# Patient Record
Sex: Male | Born: 1965 | Race: White | Hispanic: No | Marital: Married | Smoking: Never smoker
Health system: Southern US, Community
[De-identification: ages and names within clinical notes are randomized; demographics above are authoritative.]

## PROBLEM LIST (undated history)

## (undated) DIAGNOSIS — R7989 Other specified abnormal findings of blood chemistry: Secondary | ICD-10-CM

## (undated) DIAGNOSIS — J90 Pleural effusion, not elsewhere classified: Secondary | ICD-10-CM

## (undated) DIAGNOSIS — G4730 Sleep apnea, unspecified: Secondary | ICD-10-CM

## (undated) DIAGNOSIS — E669 Obesity, unspecified: Secondary | ICD-10-CM

## (undated) DIAGNOSIS — M1990 Unspecified osteoarthritis, unspecified site: Secondary | ICD-10-CM

## (undated) DIAGNOSIS — I4891 Unspecified atrial fibrillation: Secondary | ICD-10-CM

## (undated) HISTORY — PX: KNEE CARTILAGE SURGERY: SHX688

## (undated) HISTORY — DX: Pleural effusion, not elsewhere classified: J90

## (undated) HISTORY — PX: CHOLECYSTECTOMY: SHX55

## (undated) HISTORY — PX: BUNIONECTOMY: SHX129

---

## 2012-08-04 DIAGNOSIS — Z5329 Procedure and treatment not carried out because of patient's decision for other reasons: Secondary | ICD-10-CM | POA: Insufficient documentation

## 2012-08-04 HISTORY — DX: Procedure and treatment not carried out because of patient's decision for other reasons: Z53.29

## 2013-12-05 DIAGNOSIS — H4911 Fourth [trochlear] nerve palsy, right eye: Secondary | ICD-10-CM

## 2013-12-05 DIAGNOSIS — H5010 Unspecified exotropia: Secondary | ICD-10-CM

## 2013-12-05 DIAGNOSIS — IMO0002 Reserved for concepts with insufficient information to code with codable children: Secondary | ICD-10-CM | POA: Insufficient documentation

## 2013-12-05 HISTORY — DX: Unspecified exotropia: H50.10

## 2013-12-05 HISTORY — DX: Fourth (trochlear) nerve palsy, right eye: H49.11

## 2015-05-19 ENCOUNTER — Observation Stay (HOSPITAL_COMMUNITY)
Admission: EM | Admit: 2015-05-19 | Discharge: 2015-05-20 | Disposition: A | Discharge status: Home / Self Care | Payer: BLUE CROSS/BLUE SHIELD | Attending: Cardiology | Admitting: Cardiology

## 2015-05-19 ENCOUNTER — Emergency Department (HOSPITAL_COMMUNITY): Payer: BLUE CROSS/BLUE SHIELD

## 2015-05-19 ENCOUNTER — Encounter (HOSPITAL_COMMUNITY): Payer: Self-pay | Admitting: *Deleted

## 2015-05-19 DIAGNOSIS — G4730 Sleep apnea, unspecified: Secondary | ICD-10-CM | POA: Diagnosis not present

## 2015-05-19 DIAGNOSIS — Z791 Long term (current) use of non-steroidal anti-inflammatories (NSAID): Secondary | ICD-10-CM | POA: Insufficient documentation

## 2015-05-19 DIAGNOSIS — I4891 Unspecified atrial fibrillation: Secondary | ICD-10-CM

## 2015-05-19 DIAGNOSIS — E669 Obesity, unspecified: Secondary | ICD-10-CM | POA: Diagnosis not present

## 2015-05-19 DIAGNOSIS — I480 Paroxysmal atrial fibrillation: Secondary | ICD-10-CM | POA: Diagnosis not present

## 2015-05-19 HISTORY — DX: Other specified abnormal findings of blood chemistry: R79.89

## 2015-05-19 HISTORY — DX: Sleep apnea, unspecified: G47.30

## 2015-05-19 HISTORY — DX: Unspecified atrial fibrillation: I48.91

## 2015-05-19 HISTORY — DX: Obesity, unspecified: E66.9

## 2015-05-19 LAB — CBC
HCT: 46.2 % (ref 39.0–52.0)
HEMOGLOBIN: 16.6 g/dL (ref 13.0–17.0)
MCH: 32.1 pg (ref 26.0–34.0)
MCHC: 35.9 g/dL (ref 30.0–36.0)
MCV: 89.4 fL (ref 78.0–100.0)
PLATELETS: 139 10*3/uL — AB (ref 150–400)
RBC: 5.17 MIL/uL (ref 4.22–5.81)
RDW: 12.1 % (ref 11.5–15.5)
WBC: 7.6 10*3/uL (ref 4.0–10.5)

## 2015-05-19 LAB — HEPATIC FUNCTION PANEL
ALT: 20 U/L (ref 17–63)
AST: 23 U/L (ref 15–41)
Albumin: 3.7 g/dL (ref 3.5–5.0)
Alkaline Phosphatase: 66 U/L (ref 38–126)
BILIRUBIN INDIRECT: 0.4 mg/dL (ref 0.3–0.9)
Bilirubin, Direct: 0.2 mg/dL (ref 0.1–0.5)
Total Bilirubin: 0.6 mg/dL (ref 0.3–1.2)
Total Protein: 6.4 g/dL — ABNORMAL LOW (ref 6.5–8.1)

## 2015-05-19 LAB — BASIC METABOLIC PANEL
Anion gap: 9 (ref 5–15)
BUN: 13 mg/dL (ref 6–20)
CALCIUM: 9.3 mg/dL (ref 8.9–10.3)
CO2: 25 mmol/L (ref 22–32)
CREATININE: 1.04 mg/dL (ref 0.61–1.24)
Chloride: 107 mmol/L (ref 101–111)
GFR calc Af Amer: >60 mL/min (ref >60)
GFR calc non Af Amer: >60 mL/min (ref >60)
Glucose, Bld: 110 mg/dL — ABNORMAL HIGH (ref 65–99)
Potassium: 3.8 mmol/L (ref 3.5–5.1)
SODIUM: 141 mmol/L (ref 135–145)

## 2015-05-19 LAB — T4, FREE: Free T4: 0.88 ng/dL (ref 0.61–1.12)

## 2015-05-19 LAB — MAGNESIUM: Magnesium: 1.9 mg/dL (ref 1.7–2.4)

## 2015-05-19 LAB — PROTIME-INR
INR: 1.07 (ref 0.00–1.49)
Prothrombin Time: 14.1 seconds (ref 11.6–15.2)

## 2015-05-19 LAB — I-STAT TROPONIN, ED: Troponin i, poc: 0 ng/mL (ref 0.00–0.08)

## 2015-05-19 LAB — TSH: TSH: 1.493 u[IU]/mL (ref 0.350–4.500)

## 2015-05-19 MED ORDER — ACETAMINOPHEN 325 MG PO TABS: 650.0000 mg | ORAL_TABLET | ORAL | Status: DC | PRN

## 2015-05-19 MED ORDER — SODIUM CHLORIDE 0.9 % IV SOLN
INTRAVENOUS | Status: DC
  Administered 2015-05-19: 18:00:00 via INTRAVENOUS

## 2015-05-19 MED ORDER — ONDANSETRON HCL 4 MG/2ML IJ SOLN: 4.0000 mg | Freq: Four times a day (QID) | INTRAMUSCULAR | Status: DC | PRN

## 2015-05-19 MED ORDER — HEPARIN BOLUS VIA INFUSION
5000.0000 [IU] | Freq: Once | INTRAVENOUS | Status: AC
  Administered 2015-05-19: 5000 [IU] via INTRAVENOUS
  Filled 2015-05-19: qty 5000

## 2015-05-19 MED ORDER — DILTIAZEM HCL 100 MG IV SOLR
5.0000 mg/h | INTRAVENOUS | Status: DC
  Administered 2015-05-19: 5 mg/h via INTRAVENOUS
  Filled 2015-05-19 (×2): qty 100

## 2015-05-19 MED ORDER — ZOLPIDEM TARTRATE 5 MG PO TABS
5.0000 mg | ORAL_TABLET | Freq: Every evening | ORAL | Status: DC | PRN
Start: 2015-05-19 — End: 2015-05-20

## 2015-05-19 MED ORDER — CLOMIPHENE CITRATE 50 MG PO TABS
25.0000 mg | ORAL_TABLET | Freq: Every day | ORAL | Status: DC
  Filled 2015-05-19 (×2): qty 1

## 2015-05-19 MED ORDER — DILTIAZEM HCL 100 MG IV SOLR
5.0000 mg/h | INTRAVENOUS | Status: DC
  Administered 2015-05-19: 5 mg/h via INTRAVENOUS

## 2015-05-19 MED ORDER — DILTIAZEM LOAD VIA INFUSION
20.0000 mg | Freq: Once | INTRAVENOUS | Status: AC
  Administered 2015-05-19: 20 mg via INTRAVENOUS
  Filled 2015-05-19: qty 20

## 2015-05-19 MED ORDER — FLECAINIDE ACETATE 50 MG PO TABS
150.0000 mg | ORAL_TABLET | Freq: Two times a day (BID) | ORAL | Status: DC
  Administered 2015-05-19: 150 mg via ORAL
  Filled 2015-05-19 (×4): qty 1

## 2015-05-19 MED ORDER — HEPARIN (PORCINE) IN NACL 100-0.45 UNIT/ML-% IJ SOLN
1600.0000 [IU]/h | INTRAMUSCULAR | Status: DC
  Administered 2015-05-19: 1400 [IU]/h via INTRAVENOUS
  Filled 2015-05-19 (×2): qty 250

## 2015-05-19 MED ORDER — SODIUM CHLORIDE 0.9 % IV BOLUS (SEPSIS)
1000.0000 mL | Freq: Once | INTRAVENOUS | Status: AC
  Administered 2015-05-19: 1000 mL via INTRAVENOUS

## 2015-05-19 NOTE — H&P (Signed)
History and Physical   Admit date: 05/19/2015 Name:  Gabriel Carroll Medical record number: [id number] DOB/Age:  December 05, 1965  49 y.o. male  Referring Physician:   [facility]  Chief complaint/reason for admission: Atrial fibrillation   HPI:  This 49 year old male is previously been in good health.  After a prolonged bicycle ride in [place] around 5 years ago he had atrial fibrillation and was admitted to the hospital overnight.  He describes having had an echo and stress testing and converted spontaneously.  Since that time he has had episodic atrial fibrillation usually precipitated by running or exercise.  He usually has not lasted that long and typically would go away.  He had a prolonged episode that started yesterday after he had worked outside of mode the grass and while getting ready to put some things in the car last evening around 5 PM that the onset of palpitations and this has persisted until today at which point he came to the emergency room.  He was found to be in rapid atrial fibrillation and was treated with intravenous diltiazem.  It is right at the 24-hour time [place] for onset of action.  He describes sleep apnea testing in the past and has gained some weight recently.  There is no excess alcohol use and no hypertension or other risk factors.  No family history of atrial fibrillation.    Past Medical History  Diagnosis Date  . Atrial fibrillation   . Low testosterone   . Obesity (BMI 30-39.9)   . Sleep apnea      Past Surgical History  Procedure Laterality Date  . Liver doner    . Lung surgey    . Cholecystectomy    . Bunionectomy    . Shoulder surgery rt    . Knee cartilage surgery      lt   Allergies: has No Known Allergies.   Medications: Prior to Admission medications   Medication Sig Start Date End Date Taking? Authorizing Provider  clomiPHENE (CLOMID) 50 MG tablet Take 25 mg by mouth daily.   Yes Historical Provider, MD  ibuprofen (ADVIL,MOTRIN) 200 MG  tablet Take 400 mg by mouth daily as needed for headache (pain).   Yes Historical Provider, MD   Family History:  Family Status  Relation Status Death Age  . Mother Deceased 34  . Father Deceased 64    Cancer  . Paternal Aunt Deceased   . Paternal Uncle Deceased    Social History:   reports that he has never smoked. He has never used smokeless tobacco. He reports that he drinks about 3.0 oz of alcohol per week. He reports that he does not use illicit drugs.   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  Has 2 children.   Review of Systems: Wife reports that he snores and has periods of apnea at night. Other than as noted above, the remainder of the review of systems is normal  Physical Exam: BP 90/63 mmHg  Pulse 74  Temp(Src) 98.1 F (36.7 C) (Oral)  Resp 19  SpO2 96% General appearance: Pleasant mildly obese male in no acute distress Head: Normocephalic, without obvious abnormality, atraumatic Eyes: conjunctivae/corneas clear. PERRL, EOM's intact. Fundi not examined  Neck: no adenopathy, no carotid bruit, no JVD and supple, symmetrical, trachea midline Lungs: clear to auscultation bilaterally Heart:irregular rate and rhythm, S1, S2 normal, no murmur, click, rub or gallop Abdomen: soft, non-tender; bowel sounds normal; no masses,  no  organomegaly Rectal: deferred Extremities: extremities normal, atraumatic, no cyanosis or edema Pulses: 2+ and symmetric Skin: Skin color, texture, turgor normal. No rashes or lesions Neurologic: Grossly normal  Labs: CBC  Recent Labs  05/19/15 1450  WBC 7.6  RBC 5.17  HGB 16.6  HCT 46.2  PLT 139*  MCV 89.4  MCH 32.1  MCHC 35.9  RDW 12.1   CMP   Recent Labs  05/19/15 1450  NA 141  K 3.8  CL 107  CO2 25  GLUCOSE 110*  BUN 13  CREATININE 1.04  CALCIUM 9.3  GFRNONAA >60  GFRAA >60    EKG: Atrial fibrillation with rapid response  Radiology: No acute disease   IMPRESSIONS: 1.  Paroxysmal atrial  fibrillation with rapid ventricular response CHA2DS2VASC score 0 2.  Obesity 3.  Sleep apnea  PLAN: Duration of onset is around 24 hours ago.  We'll place on heparin and check echo in the morning.  Check thyroid function tests.  Trial of flecainide to see if he will be able to be converted to sinus rhythm.  May need reevaluation of sleep apnea as may be cost effective in atrial fibrillation.  May be candidate for pill in the pocket.  Signed: Kerry Hough MD [facility]  05/19/2015, 6:07 PM

## 2015-05-19 NOTE — ED Notes (Signed)
Pt reports hx of afib. Having episodes of feeling like HR is irregular, this episode has been constant since last night, now has mild chest discomfort. Denies SOB.

## 2015-05-19 NOTE — ED Provider Notes (Signed)
CSN: [id number]     Arrival date & time 05/19/15  1349 History   First MD Initiated Contact with Patient 05/19/15 1417     Chief Complaint  Patient presents with  . Irregular Heart Beat  . Chest Pain     (Consider location/radiation/quality/duration/timing/severity/associated sxs/prior Treatment) Patient is a 49 y.o. male presenting with chest pain. The history is provided by the patient.  Chest Pain Pain location:  Substernal area Pain quality: pressure   Pain radiates to:  Does not radiate Pain radiates to the back: no   Pain severity:  Mild Onset quality:  Gradual Timing:  Intermittent Progression:  Resolved Chronicity:  New Context comment:  New onset a-fib Relieved by:  Nothing Worsened by:  Nothing tried Ineffective treatments:  None tried Associated symptoms: no fever, no nausea, no shortness of breath and no weakness   Risk factors comment:  Prior episodes of a fib   Past Medical History  Diagnosis Date  . Atrial fibrillation   . Low testosterone    History reviewed. No pertinent past surgical history. History reviewed. No pertinent family history. History  Substance Use Topics  . Smoking status: Not on file  . Smokeless tobacco: Not on file  . Alcohol Use: Yes     Comment: occ    Review of Systems  Constitutional: Negative for fever.  Respiratory: Negative for shortness of breath.   Cardiovascular: Positive for chest pain.  Gastrointestinal: Negative for nausea.  Neurological: Negative for weakness.  All other systems reviewed and are negative.     Allergies  Review of patient's allergies indicates no known allergies.  Home Medications   Prior to Admission medications   Not on File   BP 92/67 mmHg  Pulse 55  Temp(Src) 98.1 F (36.7 C) (Oral)  Resp 19  SpO2 97% Physical Exam  Constitutional: He is oriented to person, place, and time. He appears well-developed and well-nourished. No distress.  HENT:  Head: Normocephalic and atraumatic.   Eyes: Conjunctivae are normal.  Neck: Neck supple. No tracheal deviation present.  Cardiovascular: Normal heart sounds.  An irregularly irregular rhythm present. Tachycardia present.   Pulmonary/Chest: Effort normal and breath sounds normal. No respiratory distress.  Abdominal: Soft. He exhibits no distension.  Neurological: He is alert and oriented to person, place, and time.  Skin: Skin is warm and dry.  Psychiatric: He has a normal mood and affect.    ED Course  Procedures (including critical care time) CRITICAL CARE Performed by: Leo Grosser Total critical care time: 30 Critical care time was exclusive of separately billable procedures and treating other patients. Critical care was necessary to treat or prevent imminent or life-threatening deterioration. Critical care was time spent personally by me on the following activities: development of treatment plan with patient and/or surrogate as well as nursing, discussions with consultants, evaluation of patient's response to treatment, examination of patient, obtaining history from patient or surrogate, ordering and performing treatments and interventions, ordering and review of laboratory studies, ordering and review of radiographic studies, pulse oximetry and re-evaluation of patient's condition.   Labs Review Labs Reviewed  CBC - Abnormal; Notable for the following:    Platelets 139 (*)    All other components within normal limits  PROTIME-INR  BASIC METABOLIC PANEL  Randolm Idol, ED    Imaging Review Dg Chest Port 1 View  05/19/2015   CLINICAL DATA:  Irregular heart beat.  EXAM: PORTABLE CHEST - 1 VIEW  COMPARISON:  None.  FINDINGS: The heart  size and mediastinal contours are within normal limits. Both lungs are clear. The visualized skeletal structures are unremarkable.  IMPRESSION: No active disease.   Electronically Signed   By: Lovey Newcomer M.D.   On: 05/19/2015 14:48   I independently viewed and interpreted the above  radiology studies and agree with radiologist report.    EKG Interpretation   Date/Time:  Sunday May 19 2015 14:06:03 EDT Ventricular Rate:  133 PR Interval:    QRS Duration: 86 QT Interval:  288 QTC Calculation: 428 R Axis:   81 Text Interpretation:  Atrial fibrillation with rapid ventricular response  Abnormal ECG Confirmed by Taira Knabe MD, Quillian Quince ([id number]) on 05/19/2015 2:15:40 PM      MDM   Final diagnoses:  Atrial fibrillation with RVR    49 year old male presents in atrial fibrillation with rapid ventricular rate. Patient started having A. fib last night and this episode has persisted longer than typical for him. He has been keeping track of his heart rate and it has been from anywhere between 120-170 at home. He has had some ongoing chest pain symptoms that are currently resolved at rest. Troponin is negative, patient is well rate controlled with diltiazem bolus and infusion, not currently anticoagulated and A. fib has been ongoing for some full day so not a good candidate for cardioversion currently.  Cardiology was consulted for admission and establishing care for this patient who is new to the area and previously was seen in [place].     Leo Grosser, MD 05/19/15 [id number]

## 2015-05-19 NOTE — H&P (Signed)
Gabriel Carroll is an 49 y.o. male.    Primary Cardiologist:NEW  No PCP Per Patient  Chief Complaint: atrial fib  HPI: 49 year old male with hx of a fib 5 years or so ago in [place].  Spent the night and converted to SR.  Since that time he has some PAF, brief episodes.  No hx of CAD, stroke, or HTN, diabetes.  Was a liver doner several years ago.    Pt went into a fib 24 hours ago and most of the time it is rapid.  On sofa it will slow down.  He has some chest tightness and anxiety feeling he has with a fib.  Mild SOB.  When he remained in a fib he came to ER.  Now on dilt drip and HR in 70-85.   CHA2DS2VASc score 0.  EKG a fib with RVR.  Troponin 0.00.  Past Medical History  Diagnosis Date  . Atrial fibrillation   . Low testosterone     Past Surgical History  Procedure Laterality Date  . Liver doner    . Lung surgey    . Cholecystectomy    . Bunionectomy    . Shoulder surgery rt    . Knee cartilage surgery      lt    Family History  Problem Relation Age of Onset  . Hypertension Mother   . Hypertension Father   . Cancer Father   . Diabetes Paternal Aunt   . Diabetes Paternal Uncle    Social History:  reports that he has never smoked. He has never used smokeless tobacco. He reports that he drinks about 3.0 oz of alcohol per week. He reports that he does not use illicit drugs.  Allergies: No Known Allergies  OUTPATIENT MEDICATIONS: Medication for low testosterone   Results for orders placed or performed during the hospital encounter of 05/19/15 (from the past 48 hour(s))  Basic metabolic panel     Status: Abnormal   Collection Time: 05/19/15  2:50 PM  Result Value Ref Range   Sodium 141 135 - 145 mmol/L   Potassium 3.8 3.5 - 5.1 mmol/L   Chloride 107 101 - 111 mmol/L   CO2 25 22 - 32 mmol/L   Glucose, Bld 110 (H) 65 - 99 mg/dL   BUN 13 6 - 20 mg/dL   Creatinine, Ser 1.04 0.61 - 1.24 mg/dL   Calcium 9.3 8.9 - 10.3 mg/dL   GFR calc non Af Amer >60  >60 mL/min   GFR calc Af Amer >60 >60 mL/min    Comment: (NOTE) The eGFR has been calculated using the CKD EPI equation. This calculation has not been validated in all clinical situations. eGFR's persistently <60 mL/min signify possible Chronic Kidney Disease.    Anion gap 9 5 - 15  CBC     Status: Abnormal   Collection Time: 05/19/15  2:50 PM  Result Value Ref Range   WBC 7.6 4.0 - 10.5 K/uL   RBC 5.17 4.22 - 5.81 MIL/uL   Hemoglobin 16.6 13.0 - 17.0 g/dL   HCT 46.2 39.0 - 52.0 %   MCV 89.4 78.0 - 100.0 fL   MCH 32.1 26.0 - 34.0 pg   MCHC 35.9 30.0 - 36.0 g/dL   RDW 12.1 11.5 - 15.5 %   Platelets 139 (L) 150 - 400 K/uL  Protime-INR     Status: None   Collection Time: 05/19/15  2:50 PM  Result Value Ref  Range   Prothrombin Time 14.1 11.6 - 15.2 seconds   INR 1.07 0.00 - 1.49  I-stat troponin, ED     Status: None   Collection Time: 05/19/15  3:03 PM  Result Value Ref Range   Troponin i, poc 0.00 0.00 - 0.08 ng/mL   Comment 3            Comment: Due to the release kinetics of cTnI, a negative result within the first hours of the onset of symptoms does not rule out myocardial infarction with certainty. If myocardial infarction is still suspected, repeat the test at appropriate intervals.    Dg Chest Port 1 View  05/19/2015   CLINICAL DATA:  Irregular heart beat.  EXAM: PORTABLE CHEST - 1 VIEW  COMPARISON:  None.  FINDINGS: The heart size and mediastinal contours are within normal limits. Both lungs are clear. The visualized skeletal structures are unremarkable.  IMPRESSION: No active disease.   Electronically Signed   By: Lovey Newcomer M.D.   On: 05/19/2015 14:48    ROS: General:no colds or fevers, no weight changes Skin:no rashes or ulcers HEENT:no blurred vision, no congestion CV:see HPI PUL:see HPI GI:no diarrhea constipation or melena, no indigestion GU:no hematuria, no dysuria MS:no joint pain, no claudication Neuro:no syncope, no lightheadedness Endo:no diabetes,  no thyroid disease  Blood pressure 90/63, pulse 74, temperature 98.1 F (36.7 C), temperature source Oral, resp. rate 19, SpO2 96 %. PE: General:Pleasant affect, NAD Skin:Warm and dry, brisk capillary refill HEENT:normocephalic, sclera clear, mucus membranes moist Neck:supple, no JVD, no bruits  Heart:irreg irreg without murmur, gallup, rub or click Lungs:clear without rales, rhonchi, or wheezes IHK:VQQV, non tender, + BS, do not palpate liver spleen or masses Ext:no lower ext edema, 2+ pedal pulses, 2+ radial pulses Neuro:alert and oriented X 3, MAE, follows commands, + facial symmetry    Assessment/Plan Active Problems:   Atrial fibrillation with RVR for greater than 24 hours.   Will add IV heparin and Dr. Wynonia Lawman to decide medication adjustments.    [place] Nurse Practitioner Certified [place] Pager [phone] or after 5pm or weekends call [phone] 05/19/2015, 5:29 PM

## 2015-05-19 NOTE — ED Notes (Signed)
DR. Wynonia Lawman WANTED PT TO RECEIVE FLECAINIDE NOW, MESSAGE HAS BEEN SENT TO PHARMACY TO SEND TO 2W ASAP.

## 2015-05-19 NOTE — Progress Notes (Signed)
ANTICOAGULATION CONSULT NOTE - Initial Consult  Pharmacy Consult for heparin Indication: atrial fibrillation  No Known Allergies  Patient Measurements: Height: 5\' 10"  (177.8 cm) Weight: 225 lb (102.059 kg) IBW/kg (Calculated) : 73 Heparin Dosing Weight: 94.5 kg  Vital Signs: Temp: 98.1 F (36.7 C) (08/07 1415) Temp Source: Oral (08/07 1415) BP: 111/61 mmHg (08/07 1800) Pulse Rate: 86 (08/07 1800)  Labs:  Recent Labs  05/19/15 1450  HGB 16.6  HCT 46.2  PLT 139*  LABPROT 14.1  INR 1.07  CREATININE 1.04    Estimated Creatinine Clearance: 101.7 mL/min (by C-G formula based on Cr of 1.04).   Medical History: Past Medical History  Diagnosis Date  . Atrial fibrillation   . Low testosterone   . Obesity (BMI 30-39.9)   . Sleep apnea     Assessment: 49 YOM with afib to start IV heparin. He is not on anticoagulation PTA, BL INR 1.07. hgb wnl, plt 139 K.  Goal of Therapy:  Heparin level 0.3-0.7 units/ml Monitor platelets by anticoagulation protocol: Yes   Plan:  - Heparin 5000 units x 1 - Heparin infusion 1400 units/hr  - 6 hr heparin level at 0100 - Daily heparin level and CBC  Maryanna Shape, PharmD, BCPS  Clinical Pharmacist  Pager: [phone]   05/19/2015,6:28 PM

## 2015-05-20 ENCOUNTER — Observation Stay (HOSPITAL_COMMUNITY): Payer: BLUE CROSS/BLUE SHIELD

## 2015-05-20 DIAGNOSIS — G4730 Sleep apnea, unspecified: Secondary | ICD-10-CM | POA: Insufficient documentation

## 2015-05-20 DIAGNOSIS — R7989 Other specified abnormal findings of blood chemistry: Secondary | ICD-10-CM | POA: Insufficient documentation

## 2015-05-20 DIAGNOSIS — E669 Obesity, unspecified: Secondary | ICD-10-CM | POA: Diagnosis not present

## 2015-05-20 DIAGNOSIS — I480 Paroxysmal atrial fibrillation: Secondary | ICD-10-CM | POA: Diagnosis not present

## 2015-05-20 DIAGNOSIS — Z791 Long term (current) use of non-steroidal anti-inflammatories (NSAID): Secondary | ICD-10-CM | POA: Diagnosis not present

## 2015-05-20 DIAGNOSIS — R0681 Apnea, not elsewhere classified: Secondary | ICD-10-CM | POA: Insufficient documentation

## 2015-05-20 LAB — BASIC METABOLIC PANEL
Anion gap: 9 (ref 5–15)
BUN: 12 mg/dL (ref 6–20)
CALCIUM: 9 mg/dL (ref 8.9–10.3)
CHLORIDE: 105 mmol/L (ref 101–111)
CO2: 25 mmol/L (ref 22–32)
Creatinine, Ser: 0.95 mg/dL (ref 0.61–1.24)
GFR calc Af Amer: >60 mL/min (ref >60)
GFR calc non Af Amer: >60 mL/min (ref >60)
Glucose, Bld: 106 mg/dL — ABNORMAL HIGH (ref 65–99)
Potassium: 3.8 mmol/L (ref 3.5–5.1)
Sodium: 139 mmol/L (ref 135–145)

## 2015-05-20 LAB — LIPID PANEL
CHOLESTEROL: 144 mg/dL (ref 0–200)
HDL: 30 mg/dL — ABNORMAL LOW (ref >40)
LDL CALC: 87 mg/dL (ref 0–99)
Total CHOL/HDL Ratio: 4.8 RATIO
Triglycerides: 134 mg/dL (ref <150)
VLDL: 27 mg/dL (ref 0–40)

## 2015-05-20 LAB — HEMOGLOBIN A1C
Hgb A1c MFr Bld: 5 % (ref 4.8–5.6)
Mean Plasma Glucose: 97 mg/dL

## 2015-05-20 LAB — CBC
HEMATOCRIT: 46.4 % (ref 39.0–52.0)
Hemoglobin: 16.1 g/dL (ref 13.0–17.0)
MCH: 31.4 pg (ref 26.0–34.0)
MCHC: 34.7 g/dL (ref 30.0–36.0)
MCV: 90.6 fL (ref 78.0–100.0)
PLATELETS: 130 10*3/uL — AB (ref 150–400)
RBC: 5.12 MIL/uL (ref 4.22–5.81)
RDW: 12.3 % (ref 11.5–15.5)
WBC: 8.2 10*3/uL (ref 4.0–10.5)

## 2015-05-20 LAB — HEPARIN LEVEL (UNFRACTIONATED): Heparin Unfractionated: 0.21 IU/mL — ABNORMAL LOW (ref 0.30–0.70)

## 2015-05-20 MED ORDER — METOPROLOL SUCCINATE ER 25 MG PO TB24
25.0000 mg | ORAL_TABLET | Freq: Every day | ORAL | Status: DC
  Administered 2015-05-20: 25 mg via ORAL
  Filled 2015-05-20: qty 1

## 2015-05-20 MED ORDER — HEPARIN BOLUS VIA INFUSION
2500.0000 [IU] | Freq: Once | INTRAVENOUS | Status: AC
  Administered 2015-05-20: 2500 [IU] via INTRAVENOUS
  Filled 2015-05-20: qty 2500

## 2015-05-20 MED ORDER — METOPROLOL SUCCINATE ER 25 MG PO TB24: 25.0000 mg | ORAL_TABLET | Freq: Every day | ORAL | Status: DC

## 2015-05-20 MED ORDER — FLECAINIDE ACETATE 150 MG PO TABS: 150.0000 mg | ORAL_TABLET | ORAL | Status: DC | PRN

## 2015-05-20 NOTE — Progress Notes (Signed)
ANTICOAGULATION CONSULT NOTE - Follow Up Consult  Pharmacy Consult for Heparin  Indication: atrial fibrillation  No Known Allergies  Patient Measurements: Height: 5\' 10"  (177.8 cm) Weight: 225 lb (102.059 kg) IBW/kg (Calculated) : 73  Vital Signs: Temp: 98.2 F (36.8 C) (08/07 1955) Temp Source: Oral (08/07 1955) BP: 101/59 mmHg (08/07 2001) Pulse Rate: 83 (08/07 1955)  Labs:  Recent Labs  05/19/15 1450 05/20/15 0035  HGB 16.6 16.1  HCT 46.2 46.4  PLT 139* 130*  LABPROT 14.1  --   INR 1.07  --   HEPARINUNFRC  --  0.21*  CREATININE 1.04 0.95    Estimated Creatinine Clearance: 111.3 mL/min (by C-G formula based on Cr of 0.95).  Assessment: 49 y/o M on heparin for afib, first HL is sub-therapeutic at 0.21, other labs reviewed. No issues per RN.   Goal of Therapy:  Heparin level 0.3-0.7 units/ml Monitor platelets by anticoagulation protocol: Yes   Plan:  -Heparin 2500 units BOLUS -Increase heparin drip to 1600 units/hr -0900 HL -Daily CBC/HL -Monitor for bleeding  Narda Bonds 05/20/2015,1:52 AM

## 2015-05-20 NOTE — Progress Notes (Signed)
  Echocardiogram 2D Echocardiogram has been performed.  Gabriel Carroll 05/20/2015, 12:21 PM

## 2015-05-20 NOTE — Progress Notes (Signed)
PT HEART RHYTHM CONVERTED TO SB RATE OF 52.EKG CONFIRMED BY EKG. STOPPED CARDIZEM GTT PER PROTOCOL. CALLED DR. AND MADE HIM AWARE. ORDERS TO DISCONTINUE CARDIZEMGT. MONITORING WILL CONTINUE.

## 2015-05-20 NOTE — Discharge Summary (Signed)
Physician Discharge Summary  Patient ID: Gabriel Carroll MRN: [id number] DOB/AGE: 50/24/1967 50 y.o.  Admit date: 05/19/2015 Discharge date: 05/20/2015  Primary Discharge Diagnosis:  1. Paroxysmal atrial fibrillation  Secondary Discharge Diagnosis: 2. Obesity 3. No testosterone treated with Clomid 4. Sleep apnea   Hospital Course: This 49 year old male has previously been in good health. After a prolonged bicycle ride in [place] around 5 years ago he had atrial fibrillation and was admitted to the hospital overnight. He describes having had an echo and stress testing and converted spontaneously. Since that time he has had episodic atrial fibrillation usually precipitated by running or exercise. He usually has not lasted that long and typically would go away. He had a prolonged episode that started yesterday after he had worked outside cutting the grass and while getting ready to put some things in the car last evening around 5 PM that the onset of palpitations and this has persisted until today at which point he came to the emergency room. He was found to be in rapid atrial fibrillation and was treated with intravenous diltiazem.he presented right at the24-hour time mark for onset of action. He describes sleep apnea testing in the past and has gained some weight recently. There is no excess alcohol use and no hypertension or other risk factors. No family history of atrial fibrillation.  His CHA2DS2VASC score Was calculated to be 0. Because it was right at 24 hours in [place] that of atrial fibrillation he was heparinized and given a dose of flat carotid 150 mg. He converted to sinus rhythm early the next day and remained in sinus rhythm. He was started on intravenous diltiazem when he came into the emergency room with slowing of his heart rate. He will have an echocardiogram that will be interpreted later and will be discharged home on metoprolol 25 mg daily. No anticoagulation necessary. He is  given a prescription of flecainide 150 mg to use as needed. Follow up in the office in one week.  Discharge Exam: Blood pressure 108/63, pulse 80, temperature 98 F (36.7 C), temperature source Oral, resp. rate 18, height 5\' 10"  (1.778 m), weight 101.9 kg (224 lb 10.4 oz), SpO2 99 %. Weight: 101.9 kg (224 lb 10.4 oz) Lungs clear, no S3  Labs: CBC:   Lab Results  Component Value Date   WBC 8.2 05/20/2015   HGB 16.1 05/20/2015   HCT 46.4 05/20/2015   MCV 90.6 05/20/2015   PLT 130* 05/20/2015    CMP:  Recent Labs Lab 05/19/15 1822 05/20/15 0035  NA  --  139  K  --  3.8  CL  --  105  CO2  --  25  BUN  --  12  CREATININE  --  0.95  CALCIUM  --  9.0  PROT 6.4*  --   BILITOT 0.6  --   ALKPHOS 66  --   ALT 20  --   AST 23  --   GLUCOSE  --  106*    Lipid Panel     Component Value Date/Time   CHOL 144 05/20/2015 0035   TRIG 134 05/20/2015 0035   HDL 30* 05/20/2015 0035   CHOLHDL 4.8 05/20/2015 0035   VLDL 27 05/20/2015 0035   LDLCALC 87 05/20/2015 0035    Cardiac Enzymes: Troponin (Point of Care Test)  Recent Labs  05/19/15 1503  TROPIPOC 0.00   Thyroid: Lab Results  Component Value Date   TSH 1.493 05/19/2015    Radiology: No active disease  EKG: Atrial fibrillation and this lay with rapid response on initial EKG. Subsequent EKG after conversion to sinus rhythm is normal with normal sinus rhythm.  Discharge Medications:   Medication List    TAKE these medications        clomiPHENE 50 MG tablet  Commonly known as:  CLOMID  Take 25 mg by mouth daily.     flecainide 150 MG tablet  Commonly known as:  TAMBOCOR  Take 1 tablet (150 mg total) by mouth as needed.     ibuprofen 200 MG tablet  Commonly known as:  ADVIL,MOTRIN  Take 400 mg by mouth daily as needed for headache (pain).     metoprolol succinate 25 MG 24 hr tablet  Commonly known as:  TOPROL-XL  Take 1 tablet (25 mg total) by mouth daily.       Followup plans and  appointments:  Follow up with Dr. Wynonia Lawman in 1 week. Call if problems. He will have outpatient sleep apnea testing. He was instructed to follow a carbohydrate restricted diet to lose weight. Time spent with patient to include physician time:  30 minutes  Signed: W. Doristine Church. MD [facility] 05/20/2015, 9:07 AM

## 2015-07-05 ENCOUNTER — Ambulatory Visit (INDEPENDENT_AMBULATORY_CARE_PROVIDER_SITE_OTHER): Payer: BLUE CROSS/BLUE SHIELD | Admitting: Primary Care

## 2015-07-05 ENCOUNTER — Encounter: Payer: Self-pay | Admitting: Primary Care

## 2015-07-05 VITALS — BP 130/68 | HR 62 | Temp 98.2°F | Ht 70.0 in | Wt 218.1 lb

## 2015-07-05 DIAGNOSIS — L989 Disorder of the skin and subcutaneous tissue, unspecified: Secondary | ICD-10-CM

## 2015-07-05 DIAGNOSIS — E291 Testicular hypofunction: Secondary | ICD-10-CM | POA: Diagnosis not present

## 2015-07-05 DIAGNOSIS — I480 Paroxysmal atrial fibrillation: Secondary | ICD-10-CM

## 2015-07-05 DIAGNOSIS — R238 Other skin changes: Secondary | ICD-10-CM

## 2015-07-05 DIAGNOSIS — R7989 Other specified abnormal findings of blood chemistry: Secondary | ICD-10-CM

## 2015-07-05 MED ORDER — TRIAMCINOLONE ACETONIDE 0.1 % EX CREA: 1.0000 "application " | TOPICAL_CREAM | Freq: Two times a day (BID) | CUTANEOUS | Status: DC

## 2015-07-05 NOTE — Progress Notes (Signed)
Subjective:    Patient ID: Gabriel Carroll, male    DOB: 1966-10-05, 49 y.o.   MRN: [id number]  HPI  Mr. Woodrome is a 49 year old male who presents today to establish care and discuss the problems mentioned below. Will obtain old records. Last physical was several years ago.  1) Paroxysmal atrial fibrillation: Diagnosed 5 years ago. He has prescriptions for metoprolol and flecainide that he is to use if he goes into atrial fibrillation without resolve in 1 hour.  He is currently following with Dr. Wynonia Lawman with cardiology. He also has a portable monitor that will send in ECG's to his cardiologist.  2) Low serum testosterone: Diagnosed 3 years ago and is currently managed on clomiphene 50 mg. He follows with a urologist in [place]. He endorses a consistent slight elevation in PSA over the years that is closely monitored.   3) Skin irritation: 2 spots noted to right anterior chest that have been present for the past 2 months. He's noticed some scaling, itching and swelling. He has not used any medications OTC. Itching will occur sporatically. Denies bleeding, drainage, inflammation.   Review of Systems  Constitutional: Negative for unexpected weight change.  HENT: Negative for rhinorrhea.   Respiratory: Negative for cough and shortness of breath.   Cardiovascular: Negative for chest pain.  Gastrointestinal: Negative for diarrhea and constipation.  Genitourinary: Negative for difficulty urinating.  Musculoskeletal: Negative for myalgias and arthralgias.  Skin: Positive for rash.  Allergic/Immunologic: Positive for environmental allergies.  Neurological: Negative for dizziness, numbness and headaches.  Psychiatric/Behavioral:       Denies concerns for anxiety or depression       Past Medical History  Diagnosis Date  . Atrial fibrillation   . Low testosterone   . Obesity (BMI 30-39.9)   . Sleep apnea     Social History   Social History  . Marital Status: Married    Spouse Name: N/A  .  Number of Children: N/A  . Years of Education: N/A   Occupational History  . Not on file.   Social History Main Topics  . Smoking status: Never Smoker   . Smokeless tobacco: Never Used  . Alcohol Use: 3.0 oz/week    5 Cans of beer per week     Comment: occ  . Drug Use: No  . Sexual Activity: Not on file   Other Topics Concern  . Not on file   Social History Narrative   Married.   2 children.   Works as a Scientist, forensic.   Enjoys mountain biking, brewing beers, hiking.     Past Surgical History  Procedure Laterality Date  . Liver doner    . Lung surgey    . Cholecystectomy    . Bunionectomy    . Shoulder surgery rt    . Knee cartilage surgery      lt    Family History  Problem Relation Age of Onset  . Hypertension Mother   . Arthritis Mother   . Hypertension Father   . Cancer Father   . Heart disease Father   . Diabetes Paternal Aunt   . Diabetes Paternal Uncle   . Diabetes Sister   . Diabetes Brother   . Asthma Maternal Grandmother   . Heart disease Maternal Grandfather   . Diabetes Paternal Grandmother     No Known Allergies  Current Outpatient Prescriptions on File Prior to Visit  Medication Sig Dispense Refill  .  clomiPHENE (CLOMID) 50 MG tablet Take 25 mg by mouth daily.    . flecainide (TAMBOCOR) 150 MG tablet Take 1 tablet (150 mg total) by mouth as needed. (Patient not taking: Reported on 07/05/2015) 10 tablet 1  . ibuprofen (ADVIL,MOTRIN) 200 MG tablet Take 400 mg by mouth daily as needed for headache (pain).    . metoprolol succinate (TOPROL-XL) 25 MG 24 hr tablet Take 1 tablet (25 mg total) by mouth daily. (Patient not taking: Reported on 07/05/2015) 30 tablet 12   No current facility-administered medications on file prior to visit.    BP 130/68 mmHg  Pulse 62  Temp(Src) 98.2 F (36.8 C) (Oral)  Ht 5\' 10"  (1.778 m)  Wt 218 lb 1.9 oz (98.939 kg)  BMI 31.30 kg/m2  SpO2 96%    Objective:   Physical Exam    Constitutional: He appears well-nourished.  Cardiovascular: Normal rate and regular rhythm.   Pulmonary/Chest: Effort normal and breath sounds normal.  Skin: Skin is warm and dry.  Two 1/2 cm circular reddened areas noted to anterior chest. No scaling, drainage, non tender.  Psychiatric: He has a normal mood and affect.          Assessment & Plan:  Skin irritation:  Present to anterior chest. 1/2 cm in diameter x 2. No scaling, drainage, non tender. Could be psoriasis, will send RX for triamcinolone cream BID. He is to follow up if no relief.

## 2015-07-05 NOTE — Assessment & Plan Note (Signed)
Managed by urology in [place]. Currently taking clomid 50 mg. History of slow elevations in PSA that is closely monitored by urologist.

## 2015-07-05 NOTE — Progress Notes (Signed)
Pre visit review using our clinic review tool, if applicable. No additional management support is needed unless otherwise documented below in the visit note. 

## 2015-07-05 NOTE — Assessment & Plan Note (Signed)
Regular rhythm and rate today. Denies chest pain. Following with cardiology.

## 2015-07-05 NOTE — Patient Instructions (Addendum)
Apply triamcinolone cream twice daily to affected area.  Please schedule a physical with me in the next 3-6 months.   It was a pleasure to meet you today! Please don't hesitate to call me with any questions. Welcome to [facility]!

## 2015-09-20 ENCOUNTER — Encounter: Payer: Self-pay | Admitting: Primary Care

## 2015-09-20 ENCOUNTER — Ambulatory Visit (INDEPENDENT_AMBULATORY_CARE_PROVIDER_SITE_OTHER): Payer: 59 | Admitting: Primary Care

## 2015-09-20 VITALS — BP 146/76 | HR 70 | Temp 97.8°F | Ht 70.0 in | Wt 231.0 lb

## 2015-09-20 DIAGNOSIS — Z1211 Encounter for screening for malignant neoplasm of colon: Secondary | ICD-10-CM | POA: Diagnosis not present

## 2015-09-20 DIAGNOSIS — Z0000 Encounter for general adult medical examination without abnormal findings: Secondary | ICD-10-CM | POA: Insufficient documentation

## 2015-09-20 NOTE — Assessment & Plan Note (Signed)
Tdap UTD. Declines flu. Referral placed for colonoscopy screening. Exam unremarkable Labs from recent hospitalization reviewed and do not need repeating. Discussed the importance of a healthy diet and regular exercise in order for weight loss and to reduce risk of other medical diseases. Follow up in 1 year for repeat physical.

## 2015-09-20 NOTE — Progress Notes (Signed)
Subjective:    Patient ID: Gabriel Carroll, male    DOB: [birth year], 49 y.o.   MRN: [id number]  HPI  Mr. Ehresmann is a 49 year old male who presents today for complete physical. His labs were completed in August 2016 during hospitalization.  Immunizations: -Tetanus: Unsure, believes it was 4 years ago. -Influenza: Declines.   Diet: Endorses a fair diet. Breakfast: Eggs/omlette, cereal Lunch: Salad with eggs, veggies Dinner: Chicken, pasta, pizza. Eats out irregularly.  Snacks: Fruit, veggies, dark chocolate Desserts: Few Fridays he will have doughnuts  Beverages: Coffee, unsweet tea, water, diet soda, beer  Exercise: He does not currently exercise. Eye exam: Completed 1 year ago. No changes in vision. Dental exam: Due Colonoscopy: Has never completed. Due.    Review of Systems  Constitutional: Negative for unexpected weight change.  HENT: Negative for rhinorrhea.   Respiratory: Negative for cough and shortness of breath.   Cardiovascular: Negative for chest pain.  Gastrointestinal: Negative for diarrhea and constipation.  Genitourinary: Negative for difficulty urinating.       PSA levels are monitored by Urologist.   Musculoskeletal: Negative for myalgias and arthralgias.  Skin: Negative for rash.  Neurological: Negative for dizziness, numbness and headaches.  Psychiatric/Behavioral:       Denies concerns for anxiety or depression       Past Medical History  Diagnosis Date  . Atrial fibrillation ([place])   . Low testosterone   . Obesity (BMI 30-39.9)   . Sleep apnea     Social History   Social History  . Marital Status: Married    Spouse Name: N/A  . Number of Children: N/A  . Years of Education: N/A   Occupational History  . Not on file.   Social History Main Topics  . Smoking status: Never Smoker   . Smokeless tobacco: Never Used  . Alcohol Use: 3.0 oz/week    5 Cans of beer per week     Comment: occ  . Drug Use: No  . Sexual Activity: Not on file    Other Topics Concern  . Not on file   Social History Narrative   Married.   2 children.   Works as a Scientist, forensic.   Enjoys mountain biking, brewing beers, hiking.     Past Surgical History  Procedure Laterality Date  . Liver doner    . Lung surgey    . Cholecystectomy    . Bunionectomy    . Shoulder surgery rt    . Knee cartilage surgery      lt    Family History  Problem Relation Age of Onset  . Hypertension Mother   . Arthritis Mother   . Hypertension Father   . Cancer Father   . Heart disease Father   . Diabetes Paternal Aunt   . Diabetes Paternal Uncle   . Diabetes Sister   . Diabetes Brother   . Asthma Maternal Grandmother   . Heart disease Maternal Grandfather   . Diabetes Paternal Grandmother     No Known Allergies  Current Outpatient Prescriptions on File Prior to Visit  Medication Sig Dispense Refill  . clomiPHENE (CLOMID) 50 MG tablet Take 25 mg by mouth daily.    . flecainide (TAMBOCOR) 150 MG tablet Take 1 tablet (150 mg total) by mouth as needed. 10 tablet 1  . ibuprofen (ADVIL,MOTRIN) 200 MG tablet Take 400 mg by mouth daily as needed for headache (pain).    Marland Kitchen  metoprolol succinate (TOPROL-XL) 25 MG 24 hr tablet Take 1 tablet (25 mg total) by mouth daily. 30 tablet 12  . triamcinolone cream (KENALOG) 0.1 % Apply 1 application topically 2 (two) times daily. 30 g 0   No current facility-administered medications on file prior to visit.    BP 146/76 mmHg  Pulse 70  Temp(Src) 97.8 F (36.6 C) (Oral)  Ht 5\' 10"  (1.778 m)  Wt 231 lb (104.781 kg)  BMI 33.15 kg/m2  SpO2 95%    Objective:   Physical Exam  Constitutional: He is oriented to person, place, and time. He appears well-nourished.  HENT:  Right Ear: Tympanic membrane and ear canal normal.  Left Ear: Tympanic membrane and ear canal normal.  Nose: Nose normal.  Mouth/Throat: Oropharynx is clear and moist.  Eyes: Conjunctivae and EOM are normal.  Pupils are equal, round, and reactive to light.  Neck: Neck supple. No thyromegaly present.  Cardiovascular: Normal rate and regular rhythm.   Pulmonary/Chest: Effort normal and breath sounds normal.  Abdominal: Soft. Bowel sounds are normal. There is no tenderness.  Musculoskeletal: Normal range of motion.  Neurological: He is alert and oriented to person, place, and time. He has normal reflexes. No cranial nerve deficit.  Skin: Skin is warm and dry.  Psychiatric: He has a normal mood and affect.          Assessment & Plan:  Elevated BP reading:  Slight elevated in office today. Recent stress with selling his house. No prior history. Currently managed on Toprol Xl for CAD. Will continue to monitor.

## 2015-09-20 NOTE — Progress Notes (Signed)
Pre visit review using our clinic review tool, if applicable. No additional management support is needed unless otherwise documented below in the visit note. 

## 2015-09-20 NOTE — Patient Instructions (Addendum)
I've reviewed your labs and they look good.  Continue your efforts towards a healthy diet. Be sure to include fresh fruits and vegetables daily.  Start exercising. You should be getting 1 hour of moderate intensity exercise 5 days weekly.  Follow up in 1 year for repeat physical.  It was a pleasure to see you today!

## 2015-10-17 ENCOUNTER — Encounter: Payer: Self-pay | Admitting: Gastroenterology

## 2015-12-06 ENCOUNTER — Encounter: Payer: Self-pay | Admitting: Gastroenterology

## 2015-12-06 ENCOUNTER — Ambulatory Visit (INDEPENDENT_AMBULATORY_CARE_PROVIDER_SITE_OTHER): Payer: 59 | Admitting: Gastroenterology

## 2015-12-06 VITALS — BP 110/70 | HR 76 | Ht 70.0 in | Wt 228.0 lb

## 2015-12-06 DIAGNOSIS — Z1211 Encounter for screening for malignant neoplasm of colon: Secondary | ICD-10-CM

## 2015-12-06 MED ORDER — NA SULFATE-K SULFATE-MG SULF 17.5-3.13-1.6 GM/177ML PO SOLN: 1.0000 | Freq: Once | ORAL | Status: DC

## 2015-12-06 NOTE — Progress Notes (Signed)
[facility] Progress Note  Chief Complaint: colon cancer screening  Subjective History:  This gentleman was referred for colon cancer screening. He feels well, with no abdominal pain rectal bleeding or weight loss. He has paroxysmal atrial fibrillation that is apparently exercise-related, so he takes as needed flecainide. He must have a low CHADS score, since he is not on aspirin or anticoagulation.  ROS: Cardiovascular:  no chest pain Respiratory: no dyspnea  Past Medical History: Past Medical History  Diagnosis Date  . Atrial fibrillation ([place])   . Low testosterone   . Obesity (BMI 30-39.9)   . Sleep apnea   Partial liver donor  Objective:  Med list reviewed  Vital signs in last 24 hrs: Filed Vitals:   12/06/15 1539  BP: 110/70  Pulse: 76    Physical Exam   HEENT: sclera anicteric, oral mucosa moist without lesions  Neck: supple, no thyromegaly, JVD or lymphadenopathy  Cardiac: RRR without murmurs, S1S2 heard, no peripheral edema  Pulm: clear to auscultation bilaterally, normal RR and effort noted  Abdomen: soft, no tenderness, with active bowel sounds. No guarding or palpable hepatosplenomegaly.  Chevron scar  Skin; warm and dry, no jaundice or rash     @ASSESSMENTPLANBEGIN @ Assessment: Encounter Diagnosis  Name Primary?  . Special screening for malignant neoplasms, colon Yes   average risk    Plan:  Colonoscopy in the [place] The benefits and risks of the planned procedure were described in detail with the patient or (when appropriate) their health care proxy.  Risks were outlined as including, but not limited to, bleeding, infection, perforation, adverse medication reaction leading to cardiac or pulmonary decompensation, or pancreatitis (if ERCP).  The limitation of incomplete mucosal visualization was also discussed.  No guarantees or warranties were given.   Nelida Meuse III

## 2015-12-06 NOTE — Patient Instructions (Signed)
You have been scheduled for a colonoscopy. Please follow written instructions given to you at your visit today.  Please pick up your prep supplies at the pharmacy within the next 1-3 days. If you use inhalers (even only as needed), please bring them with you on the day of your procedure. Your physician has requested that you go to www.startemmi.com and enter the access code given to you at your visit today. This web site gives a general overview about your procedure. However, you should still follow specific instructions given to you by our office regarding your preparation for the procedure.  Thank you for choosing [facility]   Dr Henry Danis III  

## 2016-01-22 ENCOUNTER — Ambulatory Visit (INDEPENDENT_AMBULATORY_CARE_PROVIDER_SITE_OTHER): Payer: Managed Care, Other (non HMO) | Admitting: Primary Care

## 2016-01-22 ENCOUNTER — Encounter: Payer: Self-pay | Admitting: Primary Care

## 2016-01-22 VITALS — BP 116/72 | HR 116 | Temp 97.6°F | Ht 70.0 in | Wt 233.0 lb

## 2016-01-22 DIAGNOSIS — R05 Cough: Secondary | ICD-10-CM

## 2016-01-22 DIAGNOSIS — R059 Cough, unspecified: Secondary | ICD-10-CM

## 2016-01-22 MED ORDER — AZITHROMYCIN 250 MG PO TABS: ORAL_TABLET | ORAL | Status: DC

## 2016-01-22 NOTE — Patient Instructions (Signed)
Start Azithromycin antibiotics. Take 2 tablets by mouth today, then 1 tablet daily for 4 additional days.  Cough: Robitussin DM or Delsym DM. Follow the package directions.  Your cough may linger up to 2 weeks post antibiotic treatment, but you should gradually be improving.  Increase fluids and rest. Please notify me if no improvement in 3-4 days.  It was a pleasure to see you today!

## 2016-01-22 NOTE — Progress Notes (Signed)
Subjective:    Patient ID: Gabriel Carroll, male    DOB: [birth year], 50 y.o.   MRN: [id number]  HPI  Gabriel Carroll is a 50 year old male who presents today with a chief complaint of cough. He also reports shortness of breath, wheezing ,chest congestion. Denies fevers, nausea. His cough is productive with yellow/green sputum. His symptoms have been present for past 1 week. His main complaint is chest wall pain from coughing. He's taken Zyrtec for for 1 day without improvement and has not taken anything else OTC for his symptoms. He's also recently replaced his hardwood floors twice which put out a lot of dust. Overall he's not feeling himself as he's fatigued. Denies sick contacts.   Review of Systems  Constitutional: Positive for fatigue. Negative for fever and chills.  HENT: Positive for congestion. Negative for ear pain, sinus pressure and sore throat.   Respiratory: Positive for cough, shortness of breath and wheezing.   Musculoskeletal: Negative for myalgias.       Past Medical History  Diagnosis Date  . Atrial fibrillation ([place])   . Low testosterone   . Obesity (BMI 30-39.9)   . Sleep apnea     Social History   Social History  . Marital Status: Married    Spouse Name: N/A  . Number of Children: N/A  . Years of Education: N/A   Occupational History  . Not on file.   Social History Main Topics  . Smoking status: Never Smoker   . Smokeless tobacco: Never Used  . Alcohol Use: 3.0 oz/week    5 Cans of beer per week     Comment: occ  . Drug Use: No  . Sexual Activity: Not on file   Other Topics Concern  . Not on file   Social History Narrative   Married.   2 children.   Works as a Scientist, forensic.   Enjoys mountain biking, brewing beers, hiking.     Past Surgical History  Procedure Laterality Date  . Liver doner    . Lung surgey    . Cholecystectomy    . Bunionectomy    . Shoulder surgery rt    . Knee cartilage surgery      lt     Family History  Problem Relation Age of Onset  . Hypertension Mother   . Arthritis Mother   . Hypertension Father   . Cancer Father   . Heart disease Father   . Diabetes Paternal Aunt   . Diabetes Paternal Uncle   . Diabetes Sister   . Diabetes Brother   . Asthma Maternal Grandmother   . Heart disease Maternal Grandfather   . Diabetes Paternal Grandmother     No Known Allergies  Current Outpatient Prescriptions on File Prior to Visit  Medication Sig Dispense Refill  . clomiPHENE (CLOMID) 50 MG tablet Take 25 mg by mouth daily as needed.     . flecainide (TAMBOCOR) 150 MG tablet Take 1 tablet (150 mg total) by mouth as needed. 10 tablet 1  . ibuprofen (ADVIL,MOTRIN) 200 MG tablet Take 400 mg by mouth daily as needed for headache (pain).    . metoprolol succinate (TOPROL-XL) 25 MG 24 hr tablet Take 1 tablet (25 mg total) by mouth daily. 30 tablet 12  . Na Sulfate-K Sulfate-Mg Sulf SOLN Take 1 kit by mouth once. 354 mL 0  . triamcinolone cream (KENALOG) 0.1 % Apply 1 application topically 2 (two) times  daily. 30 g 0   No current facility-administered medications on file prior to visit.    BP 116/72 mmHg  Pulse 116  Temp(Src) 97.6 F (36.4 C) (Oral)  Ht _0  (1.778 m)  Wt 233 lb (105.688 kg)  BMI 33.43 kg/m2  SpO2 97%    Objective:   Physical Exam  Constitutional: He appears well-nourished.  HENT:  Right Ear: Tympanic membrane and ear canal normal.  Left Ear: Tympanic membrane and ear canal normal.  Nose: No mucosal edema. Right sinus exhibits no maxillary sinus tenderness and no frontal sinus tenderness. Left sinus exhibits no maxillary sinus tenderness and no frontal sinus tenderness.  Mouth/Throat: Oropharynx is clear and moist.  Eyes: Conjunctivae are normal.  Neck: Neck supple.  Cardiovascular: Normal rate and regular rhythm.   Pulmonary/Chest: Effort normal. He has no decreased breath sounds. He has no wheezes. He has rhonchi in the right upper field, the  right lower field and the left lower field. He has no rales.  Skin: Skin is warm and dry.          Assessment & Plan:  Cough:  Chest congestion, fatigue, productive cough x 1 week, no improvement. Exam with rhonchi to most lobes, appears ill. Suspicious for bacterial process with green sputum production and rhonchi in most fields. Treat with Zpak. Also Robitussin DM OTC for cough and congestion. Fluids, rest, return precautions provided.

## 2016-01-22 NOTE — Progress Notes (Signed)
Pre visit review using our clinic review tool, if applicable. No additional management support is needed unless otherwise documented below in the visit note. 

## 2016-01-31 ENCOUNTER — Encounter: Payer: Self-pay | Admitting: Gastroenterology

## 2016-01-31 ENCOUNTER — Ambulatory Visit (AMBULATORY_SURGERY_CENTER): Payer: Managed Care, Other (non HMO) | Admitting: Gastroenterology

## 2016-01-31 VITALS — BP 100/69 | HR 64 | Temp 97.1°F | Resp 12 | Ht 70.0 in | Wt 235.0 lb

## 2016-01-31 DIAGNOSIS — Z1211 Encounter for screening for malignant neoplasm of colon: Secondary | ICD-10-CM | POA: Diagnosis present

## 2016-01-31 DIAGNOSIS — D122 Benign neoplasm of ascending colon: Secondary | ICD-10-CM

## 2016-01-31 MED ORDER — SODIUM CHLORIDE 0.9 % IV SOLN: 500.0000 mL | INTRAVENOUS | Status: DC

## 2016-01-31 NOTE — Op Note (Signed)
[facility] Patient Name: Gabriel Carroll Procedure Date: 01/31/2016 7:52 AM MRN: [id number] Endoscopist: [facility]. Loletha Carrow , MD Age: 50 Date of Birth: 1966-09-07 Gender: Male Procedure:                Colonoscopy Indications:              Screening for colorectal malignant neoplasm Medicines:                Monitored Anesthesia Care Procedure:                Pre-Anesthesia Assessment:                           - Prior to the procedure, a History and Physical                            was performed, and patient medications and                            allergies were reviewed. The patient's tolerance of                            previous anesthesia was also reviewed. The risks                            and benefits of the procedure and the sedation                            options and risks were discussed with the patient.                            All questions were answered, and informed consent                            was obtained. Prior Anticoagulants: The patient has                            taken no previous anticoagulant or antiplatelet                            agents. ASA Grade Assessment: II - A patient with                            mild systemic disease. After reviewing the risks                            and benefits, the patient was deemed in                            satisfactory condition to undergo the procedure.                           After obtaining informed consent, the colonoscope  was passed under direct vision. Throughout the                            procedure, the patient's blood pressure, pulse, and                            oxygen saturations were monitored continuously. The                            Model CF-HQ190L [id number]) scope was introduced                            through the anus and advanced to the the terminal                            ileum. The colonoscopy was performed without                 difficulty. The patient tolerated the procedure                            well. The quality of the bowel preparation was                            excellent. The terminal ileum, ileocecal valve,                            appendiceal orifice, and rectum were photographed.                            The bowel preparation used was Miralax. Scope In: 8:10:53 AM Scope Out: 8:23:02 AM Scope Withdrawal Time: 0 hours 8 minutes 34 seconds  Total Procedure Duration: 0 hours 12 minutes 9 seconds  Findings:                 The perianal and digital rectal examinations were                            normal.                           A 2 mm polyp was found in the proximal ascending                            colon. The polyp was sessile. The polyp was removed                            with a piecemeal technique using a cold biopsy                            forceps. Resection and retrieval were complete.                           The exam was otherwise without abnormality on  direct and retroflexion views. Complications:            No immediate complications. Estimated Blood Loss:     Estimated blood loss: none. Impression:               - One 2 mm polyp in the proximal ascending colon,                            removed piecemeal using a cold biopsy forceps.                            Resected and retrieved.                           - The examination was otherwise normal on direct                            and retroflexion views. Recommendation:           - Patient has a contact number available for                            emergencies. The signs and symptoms of potential                            delayed complications were discussed with the                            patient. Return to normal activities tomorrow.                            Written discharge instructions were provided to the                            patient.                            - Resume previous diet.                           - Continue present medications.                           - Await pathology results.                           - Repeat colonoscopy is recommended for                            surveillance. The colonoscopy date will be                            determined after pathology results from today's                            exam become available for review. Henry L. Loletha Carrow, MD 01/31/2016 8:29:53 AM This report has been signed electronically.

## 2016-01-31 NOTE — Progress Notes (Signed)
Called to room to assist during endoscopic procedure.  Patient ID and intended procedure confirmed with present staff. Received instructions for my participation in the procedure from the performing physician.  

## 2016-01-31 NOTE — Patient Instructions (Signed)
YOU HAD AN ENDOSCOPIC PROCEDURE TODAY AT THE [facility]:   Refer to the procedure report that was given to you for any specific questions about what was found during the examination.  If the procedure report does not answer your questions, please call your gastroenterologist to clarify.  If you requested that your care partner not be given the details of your procedure findings, then the procedure report has been included in a sealed envelope for you to review at your convenience later.  YOU SHOULD EXPECT: Some feelings of bloating in the abdomen. Passage of more gas than usual.  Walking can help get rid of the air that was put into your GI tract during the procedure and reduce the bloating. If you had a lower endoscopy (such as a colonoscopy or flexible sigmoidoscopy) you may notice spotting of blood in your stool or on the toilet paper. If you underwent a bowel prep for your procedure, you may not have a normal bowel movement for a few days.  Please Note:  You might notice some irritation and congestion in your nose or some drainage.  This is from the oxygen used during your procedure.  There is no need for concern and it should clear up in a day or so.  SYMPTOMS TO REPORT IMMEDIATELY:   Following lower endoscopy (colonoscopy or flexible sigmoidoscopy):  Excessive amounts of blood in the stool  Significant tenderness or worsening of abdominal pains  Swelling of the abdomen that is new, acute  Fever of 100F or higher   For urgent or emergent issues, a gastroenterologist can be reached at any hour by calling [phone].   DIET: Your first meal following the procedure should be a small meal and then it is ok to progress to your normal diet. Heavy or fried foods are harder to digest and may make you feel nauseous or bloated.  Likewise, meals heavy in dairy and vegetables can increase bloating.  Drink plenty of fluids but you should avoid alcoholic beverages for 24  hours.  ACTIVITY:  You should plan to take it easy for the rest of today and you should NOT DRIVE or use heavy machinery until tomorrow (because of the sedation medicines used during the test).    FOLLOW UP: Our staff will call the number listed on your records the next business day following your procedure to check on you and address any questions or concerns that you may have regarding the information given to you following your procedure. If we do not reach you, we will leave a message.  However, if you are feeling well and you are not experiencing any problems, there is no need to return our call.  We will assume that you have returned to your regular daily activities without incident.  If any biopsies were taken you will be contacted by phone or by letter within the next 1-3 weeks.  Please call us at [phone] if you have not heard about the biopsies in 3 weeks.    SIGNATURES/CONFIDENTIALITY: You and/or your care partner have signed paperwork which will be entered into your electronic medical record.  These signatures attest to the fact that that the information above on your After Visit Summary has been reviewed and is understood.  Full responsibility of the confidentiality of this discharge information lies with you and/or your care-partner.  Polyps-handout given  Repeat colonoscopy will be determined by pathology.  

## 2016-01-31 NOTE — Progress Notes (Signed)
To pacu vss patent aw reprot to rn 

## 2016-02-03 ENCOUNTER — Telehealth: Payer: Self-pay

## 2016-02-03 NOTE — Telephone Encounter (Signed)
I phoned pt this am for follow up call.  He c/o of spots/ dots all over his chest and back.  No other places on his body noted.  Pt denies itching.  And no other sx noted.  He said he wondered if this reaction came from the propofol.  I advised him I would send this note to Dr. Loletha Carrow and Osvaldo Angst, CRNA and we would call him back.  If rx is sent in pt uses [facility] at [address] and [address].

## 2016-02-03 NOTE — Telephone Encounter (Signed)
Anne Ng,  I spoke to Mr. Mura today and we discussed his concerns.  I indicated the rash was probably not  associated with the propofol and he agreed.  He will f/u with any other concerns should they arise.  Thanks,  Osvaldo Angst

## 2016-02-03 NOTE — Telephone Encounter (Signed)
  Follow up Call-  Call back number 01/31/2016  Post procedure Call Back phone  # [phone]  Permission to leave phone message Yes     Patient questions:  Do you have a fever, pain , or abdominal swelling? No. Pain Score  0 *  Have you tolerated food without any problems? Yes.    Have you been able to return to your normal activities? Yes.    Do you have any questions about your discharge instructions: Diet   No. Medications  No. Follow up visit  No.  Do you have questions or concerns about your Care? Yes.    Actions: * If pain score is 4 or above: No action needed, pain <4.   I phoned follow up call to pt.  He said he

## 2016-02-04 ENCOUNTER — Encounter: Payer: Self-pay | Admitting: Gastroenterology

## 2016-02-21 ENCOUNTER — Ambulatory Visit (INDEPENDENT_AMBULATORY_CARE_PROVIDER_SITE_OTHER): Payer: Managed Care, Other (non HMO) | Admitting: Family Medicine

## 2016-02-21 ENCOUNTER — Encounter: Payer: Self-pay | Admitting: Family Medicine

## 2016-02-21 VITALS — BP 115/66 | HR 65 | Temp 98.6°F | Ht 70.0 in | Wt 230.0 lb

## 2016-02-21 DIAGNOSIS — L739 Follicular disorder, unspecified: Secondary | ICD-10-CM

## 2016-02-21 DIAGNOSIS — J209 Acute bronchitis, unspecified: Secondary | ICD-10-CM

## 2016-02-21 MED ORDER — DOXYCYCLINE HYCLATE 100 MG PO CAPS: 100.0000 mg | ORAL_CAPSULE | Freq: Two times a day (BID) | ORAL | Status: AC

## 2016-02-21 NOTE — Progress Notes (Signed)
   Subjective:    Patient ID: Gabriel Carroll, male    DOB: 1966/01/22, 50 y.o.   MRN: [id number]  HPI Here for a cough tat has lasted for about a month. At first it produced yellow sputum but now it is white. No fevers. He saw Dr. Carlis Abbott on 01-22-16 for this and was given a Zpack. He seemed to improve but the cough never went away. Now for 4 days he feels worse again. He also asks about an asymptomatic rash on the trunk that appeared a week ago.   Review of Systems  Constitutional: Negative.   HENT: Positive for congestion and postnasal drip. Negative for ear pain, sinus pressure and sore throat.   Eyes: Negative.   Respiratory: Positive for cough and chest tightness. Negative for shortness of breath and wheezing.   Cardiovascular: Negative.   Skin: Positive for rash.  Neurological: Negative.        Objective:   Physical Exam  Constitutional: He is oriented to person, place, and time. He appears well-developed and well-nourished.  HENT:  Right Ear: External ear normal.  Left Ear: External ear normal.  Nose: Nose normal.  Mouth/Throat: Oropharynx is clear and moist.  Eyes: Conjunctivae are normal.  Neck: No thyromegaly present.  Cardiovascular: Normal rate, regular rhythm, normal heart sounds and intact distal pulses.   Pulmonary/Chest: Effort normal and breath sounds normal. No respiratory distress. He has no wheezes. He has no rales.  Lymphadenopathy:    He has no cervical adenopathy.  Neurological: He is alert and oriented to person, place, and time.  Skin:  Fine red papules scattered over the trunk           Assessment & Plan:  He has partially treated bronchitis and a folliculitis. Treat both with Doxycycline. Laurey Morale, MD

## 2016-02-21 NOTE — Progress Notes (Signed)
Pre visit review using our clinic review tool, if applicable. No additional management support is needed unless otherwise documented below in the visit note. 

## 2017-04-09 DIAGNOSIS — D2261 Melanocytic nevi of right upper limb, including shoulder: Secondary | ICD-10-CM | POA: Diagnosis not present

## 2017-04-09 DIAGNOSIS — L821 Other seborrheic keratosis: Secondary | ICD-10-CM | POA: Diagnosis not present

## 2017-04-09 DIAGNOSIS — D2262 Melanocytic nevi of left upper limb, including shoulder: Secondary | ICD-10-CM | POA: Diagnosis not present

## 2017-04-09 DIAGNOSIS — D225 Melanocytic nevi of trunk: Secondary | ICD-10-CM | POA: Diagnosis not present

## 2017-06-22 DIAGNOSIS — M75102 Unspecified rotator cuff tear or rupture of left shoulder, not specified as traumatic: Secondary | ICD-10-CM | POA: Diagnosis not present

## 2017-06-22 DIAGNOSIS — M542 Cervicalgia: Secondary | ICD-10-CM | POA: Diagnosis not present

## 2017-06-22 DIAGNOSIS — M25511 Pain in right shoulder: Secondary | ICD-10-CM | POA: Diagnosis not present

## 2017-06-25 DIAGNOSIS — M25612 Stiffness of left shoulder, not elsewhere classified: Secondary | ICD-10-CM | POA: Diagnosis not present

## 2017-06-25 DIAGNOSIS — M25512 Pain in left shoulder: Secondary | ICD-10-CM | POA: Diagnosis not present

## 2017-06-25 DIAGNOSIS — M7542 Impingement syndrome of left shoulder: Secondary | ICD-10-CM | POA: Diagnosis not present

## 2017-07-15 DIAGNOSIS — M25612 Stiffness of left shoulder, not elsewhere classified: Secondary | ICD-10-CM | POA: Diagnosis not present

## 2017-07-22 DIAGNOSIS — M25512 Pain in left shoulder: Secondary | ICD-10-CM | POA: Diagnosis not present

## 2017-07-27 DIAGNOSIS — M7542 Impingement syndrome of left shoulder: Secondary | ICD-10-CM | POA: Diagnosis not present

## 2017-07-29 NOTE — H&P (Signed)
PREOPERATIVE H&P  Chief Complaint: Other articular cartilage disorders, left shoulder, Primary osteoarthritis, left shoulder, Impingment syndrome of left shoulder, Strain of muscle(s) and tendon(s) of rotator cuff of  HPI: Gabriel Carroll is a 51 y.o. male who presents for preoperative history and physical with a diagnosis of Other articular cartilage disorders, left shoulder, Primary osteoarthritis, left shoulder, Impingment syndrome of left shoulder, Strain of muscle(s) and tendon(s) of rotator cuff of. Symptoms are rated as moderate to severe, and have been worsening.  This is significantly impairing activities of daily living.  He has elected for surgical management.   Past Medical History:  Diagnosis Date  . Atrial fibrillation ([place])   . Low testosterone   . Obesity (BMI 30-39.9)   . Sleep apnea    no CPAP   Past Surgical History:  Procedure Laterality Date  . BUNIONECTOMY    . CHOLECYSTECTOMY    . KNEE CARTILAGE SURGERY     lt  . liver doner    . lung surgey    . shoulder surgery rt     Social History   Social History  . Marital status: Married    Spouse name: N/A  . Number of children: N/A  . Years of education: N/A   Social History Main Topics  . Smoking status: Never Smoker  . Smokeless tobacco: Never Used  . Alcohol use 3.0 oz/week    5 Cans of beer per week     Comment: occ  . Drug use: No  . Sexual activity: Not on file   Other Topics Concern  . Not on file   Social History Narrative   Married.   2 children.   Works as a Scientist, forensic.   Enjoys mountain biking, brewing beers, hiking.    Family History  Problem Relation Age of Onset  . Hypertension Mother   . Arthritis Mother   . Hypertension Father   . Cancer Father   . Heart disease Father   . Diabetes Paternal Aunt   . Diabetes Paternal Uncle   . Diabetes Sister   . Diabetes Brother   . Asthma Maternal Grandmother   . Heart disease Maternal Grandfather   .  Diabetes Paternal Grandmother   . Colon cancer Neg Hx   . Esophageal cancer Neg Hx   . Rectal cancer Neg Hx   . Stomach cancer Neg Hx    No Known Allergies Prior to Admission medications   Medication Sig Start Date End Date Taking? Authorizing Provider  clomiPHENE (CLOMID) 50 MG tablet Take 25 mg by mouth daily as needed.     [provider]  flecainide (TAMBOCOR) 150 MG tablet Take 1 tablet (150 mg total) by mouth as needed. 05/20/15   Jacolyn Reedy, MD  ibuprofen (ADVIL,MOTRIN) 200 MG tablet Take 400 mg by mouth daily as needed for headache (pain).    [provider]  metoprolol succinate (TOPROL-XL) 25 MG 24 hr tablet Take 1 tablet (25 mg total) by mouth daily. 05/20/15   Jacolyn Reedy, MD  triamcinolone cream (KENALOG) 0.1 % Apply 1 application topically 2 (two) times daily. 07/05/15   Pleas Koch, NP     Positive ROS: All other systems have been reviewed and were otherwise negative with the exception of those mentioned in the HPI and as above.  Physical Exam: General: Alert, no acute distress Cardiovascular: No pedal edema Respiratory: No cyanosis, no use of accessory musculature GI: No organomegaly, abdomen  is soft and non-tender Skin: No lesions in the area of chief complaint Neurologic: Sensation intact distally Psychiatric: Patient is competent for consent with normal mood and affect Lymphatic: No axillary or cervical lymphadenopathy  MUSCULOSKELETAL: Left shoulder: pain with overhead activity, +speeds, +ac ttp, + impingement signs.    Assessment: Other articular cartilage disorders, left shoulder, Primary osteoarthritis, left shoulder, Impingment syndrome of left shoulder, Strain of muscle(s) and tendon(s) of rotator cuff of  Plan: Plan for Procedure(s): LEFT SHOULDER ARTHROSCOPY, DEBRIDEMENT, ACROMIOPLASTY WITH DISTAL CLAVICLE EXCISION, POSSIBLE ROTATOR CUFF REPAIR AND BICEP TENODESIS SCOPE VS OPEN  The risks benefits and alternatives were  discussed with the patient including but not limited to the risks of nonoperative treatment, versus surgical intervention including infection, bleeding, nerve injury,  blood clots, cardiopulmonary complications, morbidity, mortality, among others, and they were willing to proceed.   Hiram Gash, MD  07/29/2017 2:17 PM

## 2017-08-06 DIAGNOSIS — I480 Paroxysmal atrial fibrillation: Secondary | ICD-10-CM | POA: Diagnosis not present

## 2017-08-06 DIAGNOSIS — E668 Other obesity: Secondary | ICD-10-CM | POA: Diagnosis not present

## 2017-08-06 DIAGNOSIS — G4733 Obstructive sleep apnea (adult) (pediatric): Secondary | ICD-10-CM | POA: Diagnosis not present

## 2017-08-10 ENCOUNTER — Encounter (HOSPITAL_BASED_OUTPATIENT_CLINIC_OR_DEPARTMENT_OTHER): Payer: Self-pay | Admitting: *Deleted

## 2017-08-17 DIAGNOSIS — R0781 Pleurodynia: Secondary | ICD-10-CM | POA: Diagnosis not present

## 2017-08-17 DIAGNOSIS — S62307A Unspecified fracture of fifth metacarpal bone, left hand, initial encounter for closed fracture: Secondary | ICD-10-CM | POA: Diagnosis not present

## 2017-08-18 ENCOUNTER — Other Ambulatory Visit: Payer: Self-pay | Admitting: Orthopaedic Surgery

## 2017-08-18 ENCOUNTER — Ambulatory Visit
Admission: RE | Admit: 2017-08-18 | Discharge: 2017-08-18 | Disposition: A | Discharge status: Home / Self Care | Payer: BLUE CROSS/BLUE SHIELD | Source: Ambulatory Visit | Attending: Orthopaedic Surgery | Admitting: Orthopaedic Surgery

## 2017-08-18 DIAGNOSIS — S62306A Unspecified fracture of fifth metacarpal bone, right hand, initial encounter for closed fracture: Secondary | ICD-10-CM | POA: Diagnosis not present

## 2017-08-18 DIAGNOSIS — S62317A Displaced fracture of base of fifth metacarpal bone. left hand, initial encounter for closed fracture: Secondary | ICD-10-CM

## 2017-08-18 DIAGNOSIS — S60222A Contusion of left hand, initial encounter: Secondary | ICD-10-CM | POA: Diagnosis not present

## 2017-08-19 ENCOUNTER — Ambulatory Visit (HOSPITAL_BASED_OUTPATIENT_CLINIC_OR_DEPARTMENT_OTHER)
Admission: RE | Admit: 2017-08-19 | Payer: BLUE CROSS/BLUE SHIELD | Source: Ambulatory Visit | Admitting: Orthopaedic Surgery

## 2017-08-19 HISTORY — DX: Unspecified osteoarthritis, unspecified site: M19.90

## 2017-08-19 SURGERY — SHOULDER ARTHROSCOPY WITH SUBACROMIAL DECOMPRESSION, ROTATOR CUFF REPAIR AND BICEP TENDON REPAIR
Anesthesia: Choice | Site: Shoulder | Laterality: Left

## 2017-08-20 DIAGNOSIS — S62316D Displaced fracture of base of fifth metacarpal bone, right hand, subsequent encounter for fracture with routine healing: Secondary | ICD-10-CM

## 2017-08-20 DIAGNOSIS — M79641 Pain in right hand: Secondary | ICD-10-CM | POA: Diagnosis not present

## 2017-08-20 DIAGNOSIS — S62316A Displaced fracture of base of fifth metacarpal bone, right hand, initial encounter for closed fracture: Secondary | ICD-10-CM | POA: Diagnosis not present

## 2017-08-20 HISTORY — DX: Displaced fracture of base of fifth metacarpal bone, right hand, subsequent encounter for fracture with routine healing: S62.316D

## 2017-08-24 ENCOUNTER — Other Ambulatory Visit: Payer: BLUE CROSS/BLUE SHIELD

## 2017-08-30 DIAGNOSIS — S62316D Displaced fracture of base of fifth metacarpal bone, right hand, subsequent encounter for fracture with routine healing: Secondary | ICD-10-CM | POA: Diagnosis not present

## 2017-09-10 DIAGNOSIS — S62316D Displaced fracture of base of fifth metacarpal bone, right hand, subsequent encounter for fracture with routine healing: Secondary | ICD-10-CM | POA: Diagnosis not present

## 2017-09-25 ENCOUNTER — Encounter (HOSPITAL_COMMUNITY): Payer: Self-pay | Admitting: Emergency Medicine

## 2017-09-25 ENCOUNTER — Emergency Department (HOSPITAL_COMMUNITY)
Admission: EM | Admit: 2017-09-25 | Discharge: 2017-09-26 | Disposition: A | Discharge status: Home / Self Care | Payer: BLUE CROSS/BLUE SHIELD | Attending: Emergency Medicine | Admitting: Emergency Medicine

## 2017-09-25 DIAGNOSIS — Z79899 Other long term (current) drug therapy: Secondary | ICD-10-CM | POA: Diagnosis not present

## 2017-09-25 DIAGNOSIS — F10920 Alcohol use, unspecified with intoxication, uncomplicated: Secondary | ICD-10-CM | POA: Diagnosis not present

## 2017-09-25 DIAGNOSIS — Y908 Blood alcohol level of 240 mg/100 ml or more: Secondary | ICD-10-CM | POA: Insufficient documentation

## 2017-09-25 NOTE — ED Notes (Signed)
Bed: [facility] Expected date:  Expected time:  Means of arrival:  Comments: EMS 51 yo male intoxicated

## 2017-09-25 NOTE — ED Triage Notes (Signed)
Per EMS , pt. From his home reported for ETOH with N/V, pt. Was drinking beer and liquor since 7pm in a bar with his friends, friends reported to EMS that pt. Was unable to walk towards his car so they took him home but still unable to stand up , reported of vomiting several times.

## 2017-09-26 ENCOUNTER — Other Ambulatory Visit: Payer: Self-pay

## 2017-09-26 LAB — CBC WITH DIFFERENTIAL/PLATELET
Basophils Absolute: 0 10*3/uL (ref 0.0–0.1)
Basophils Relative: 0 %
Eosinophils Absolute: 0.1 10*3/uL (ref 0.0–0.7)
Eosinophils Relative: 1 %
HEMATOCRIT: 44.6 % (ref 39.0–52.0)
Hemoglobin: 15.9 g/dL (ref 13.0–17.0)
LYMPHS ABS: 1.2 10*3/uL (ref 0.7–4.0)
Lymphocytes Relative: 12 %
MCH: 32.4 pg (ref 26.0–34.0)
MCHC: 35.7 g/dL (ref 30.0–36.0)
MCV: 90.8 fL (ref 78.0–100.0)
MONO ABS: 0.2 10*3/uL (ref 0.1–1.0)
MONOS PCT: 3 %
NEUTROS ABS: 8.2 10*3/uL — AB (ref 1.7–7.7)
NEUTROS PCT: 84 %
Platelets: 133 10*3/uL — ABNORMAL LOW (ref 150–400)
RBC: 4.91 MIL/uL (ref 4.22–5.81)
RDW: 11.9 % (ref 11.5–15.5)
WBC: 9.7 10*3/uL (ref 4.0–10.5)

## 2017-09-26 LAB — COMPREHENSIVE METABOLIC PANEL
ALBUMIN: 4.6 g/dL (ref 3.5–5.0)
ALK PHOS: 70 U/L (ref 38–126)
ALT: 34 U/L (ref 17–63)
ANION GAP: 12 (ref 5–15)
AST: 31 U/L (ref 15–41)
BILIRUBIN TOTAL: 0.7 mg/dL (ref 0.3–1.2)
BUN: 17 mg/dL (ref 6–20)
CALCIUM: 9 mg/dL (ref 8.9–10.3)
CO2: 21 mmol/L — ABNORMAL LOW (ref 22–32)
Chloride: 109 mmol/L (ref 101–111)
Creatinine, Ser: 1.07 mg/dL (ref 0.61–1.24)
GLUCOSE: 117 mg/dL — AB (ref 65–99)
Potassium: 3.6 mmol/L (ref 3.5–5.1)
Sodium: 142 mmol/L (ref 135–145)
TOTAL PROTEIN: 7.8 g/dL (ref 6.5–8.1)

## 2017-09-26 LAB — ETHANOL: Alcohol, Ethyl (B): 282 mg/dL — ABNORMAL HIGH (ref <10)

## 2017-09-26 LAB — PROTIME-INR
INR: 1.01
Prothrombin Time: 13.2 seconds (ref 11.4–15.2)

## 2017-09-26 LAB — LIPASE, BLOOD: LIPASE: 23 U/L (ref 11–51)

## 2017-09-26 MED ORDER — THIAMINE HCL 100 MG/ML IJ SOLN
100.0000 mg | Freq: Once | INTRAMUSCULAR | Status: AC
  Administered 2017-09-26: 100 mg via INTRAVENOUS
  Filled 2017-09-26: qty 2

## 2017-09-26 MED ORDER — FOLIC ACID 5 MG/ML IJ SOLN
1.0000 mg | Freq: Once | INTRAMUSCULAR | Status: AC
  Administered 2017-09-26: 1 mg via INTRAVENOUS
  Filled 2017-09-26: qty 0.2

## 2017-09-26 MED ORDER — SODIUM CHLORIDE 0.9 % IV BOLUS (SEPSIS)
1000.0000 mL | Freq: Once | INTRAVENOUS | Status: AC
  Administered 2017-09-26: 1000 mL via INTRAVENOUS

## 2017-09-26 NOTE — Discharge Instructions (Signed)
Please follow with your primary care doctor in the next 2 days for a check-up. They must obtain records for further management.  ° °Do not hesitate to return to the Emergency Department for any new, worsening or concerning symptoms.  ° °

## 2017-09-26 NOTE — ED Provider Notes (Signed)
[place] DEPT Provider Note   CSN: [id number] Arrival date & time: 09/25/17  2308     History   Chief Complaint Chief Complaint  Patient presents with  . Alcohol Intoxication   HPI  Blood pressure 129/80, pulse (!) 106, temperature 98.2 F (36.8 C), temperature source Oral, resp. rate 17, SpO2 96 %.  Gabriel Carroll is a 51 y.o. male with past medical history for paroxysmal A. fib (not anticoagulated) brought in by EMS for intoxication.  He is accompanied by his wife who states that they were at a party this evening, she went home his friends brought him home but he was unable to enter the house, on the street in front of the house.  Level 5 caveat secondary to intoxication  Past Medical History:  Diagnosis Date  . Arthritis    OA left shoulder  . Atrial fibrillation ([place])   . Low testosterone   . Obesity (BMI 30-39.9)   . Sleep apnea    no CPAP    Patient Active Problem List   Diagnosis Date Noted  . Preventative health care 09/20/2015  . Sleep apnea 05/20/2015  . Obesity (BMI 30-39.9) 05/20/2015  . Low serum testosterone 05/20/2015  . Paroxysmal atrial fibrillation [facility])     Past Surgical History:  Procedure Laterality Date  . BUNIONECTOMY    . CHOLECYSTECTOMY    . KNEE CARTILAGE SURGERY     lt  . liver doner    . lung surgey    . shoulder surgery rt         Home Medications    Prior to Admission medications   Medication Sig Start Date End Date Taking? Authorizing Provider  acetaminophen (TYLENOL) 500 MG tablet Take 500 mg by mouth every 6 (six) hours as needed for mild pain, moderate pain or headache.    Yes [provider]  clomiPHENE (CLOMID) 50 MG tablet Take 25 mg by mouth daily.    Yes [provider]  meloxicam (MOBIC) 15 MG tablet Take 15 mg by mouth daily.   Yes [provider]  flecainide (TAMBOCOR) 150 MG tablet Take 1 tablet (150 mg total) by mouth as needed. Patient not taking:  Reported on 09/26/2017 05/20/15   Jacolyn Reedy, MD  metoprolol succinate (TOPROL-XL) 25 MG 24 hr tablet Take 1 tablet (25 mg total) by mouth daily. Patient not taking: Reported on 09/26/2017 05/20/15   Jacolyn Reedy, MD    Family History Family History  Problem Relation Age of Onset  . Hypertension Mother   . Arthritis Mother   . Hypertension Father   . Cancer Father   . Heart disease Father   . Diabetes Paternal Aunt   . Diabetes Paternal Uncle   . Diabetes Sister   . Diabetes Brother   . Asthma Maternal Grandmother   . Heart disease Maternal Grandfather   . Diabetes Paternal Grandmother   . Colon cancer Neg Hx   . Esophageal cancer Neg Hx   . Rectal cancer Neg Hx   . Stomach cancer Neg Hx     Social History Social History   Tobacco Use  . Smoking status: Never Smoker  . Smokeless tobacco: Never Used  Substance Use Topics  . Alcohol use: Yes    Alcohol/week: 3.0 oz    Types: 5 Cans of beer per week    Comment: occ  . Drug use: No     Allergies   Vancomycin   Review of Systems  Review of Systems  A complete review of systems was obtained and all systems are negative except as noted in the HPI and PMH.   Physical Exam Updated Vital Signs BP 113/76   Pulse 76   Temp 98.2 F (36.8 C) (Oral)   Resp 16   SpO2 93%   Physical Exam  Constitutional: He is oriented to person, place, and time. He appears well-developed and well-nourished. No distress.  HENT:  Head: Normocephalic and atraumatic.  Mouth/Throat: Oropharynx is clear and moist.  Eyes: Conjunctivae and EOM are normal. Pupils are equal, round, and reactive to light.  Neck: Normal range of motion.  Cardiovascular: Normal rate, regular rhythm and intact distal pulses.  Pulmonary/Chest: Effort normal and breath sounds normal.  Abdominal: Soft. There is no tenderness.  Musculoskeletal: Normal range of motion.  Neurological: He is oriented to person, place, and time.  Somnolent, withdraws to pain    Skin: He is not diaphoretic.  Psychiatric: He has a normal mood and affect.  Nursing note and vitals reviewed.    ED Treatments / Results  Labs (all labs ordered are listed, but only abnormal results are displayed) Labs Reviewed  CBC WITH DIFFERENTIAL/PLATELET - Abnormal; Notable for the following components:      Result Value   Platelets 133 (*)    Neutro Abs 8.2 (*)    All other components within normal limits  COMPREHENSIVE METABOLIC PANEL - Abnormal; Notable for the following components:   CO2 21 (*)    Glucose, Bld 117 (*)    All other components within normal limits  ETHANOL - Abnormal; Notable for the following components:   Alcohol, Ethyl (B) 282 (*)    All other components within normal limits  LIPASE, BLOOD  PROTIME-INR    EKG  EKG Interpretation  Date/Time:  Sunday September 26 2017 04:19:33 EST Ventricular Rate:  85 PR Interval:    QRS Duration: 96 QT Interval:  370 QTC Calculation: 440 R Axis:   -2 Text Interpretation:  Sinus rhythm Probable left atrial enlargement Confirmed by Veryl Speak [phone]) on 09/26/2017 4:58:55 AM       Radiology No results found.  Procedures Procedures (including critical care time)  Medications Ordered in ED Medications  sodium chloride 0.9 % bolus 1,000 mL (0 mLs Intravenous Stopped 09/26/17 0154)  thiamine (B-1) injection 100 mg (100 mg Intravenous Given 42/59/56 3875)  folic acid 1 mg in sodium chloride 0.9 % 50 mL IVPB (0 mg Intravenous Stopped 09/26/17 0202)     Initial Impression / Assessment and Plan / ED Course  I have reviewed the triage vital signs and the nursing notes.  Pertinent labs & imaging results that were available during my care of the patient were reviewed by me and considered in my medical decision making (see chart for details).     Vitals:   09/25/17 2312 09/25/17 2328 09/26/17 0234 09/26/17 0541  BP:  129/80 121/79 113/76  Pulse:  (!) 106 80 76  Resp:  17 18 16   Temp:  98.2 F (36.8  C)    TempSrc:  Oral    SpO2: 95% 96% 95% 93%    Medications  sodium chloride 0.9 % bolus 1,000 mL (0 mLs Intravenous Stopped 09/26/17 0154)  thiamine (B-1) injection 100 mg (100 mg Intravenous Given 64/33/29 5188)  folic acid 1 mg in sodium chloride 0.9 % 50 mL IVPB (0 mg Intravenous Stopped 09/26/17 0202)    Emigdio Wildeman is 51 y.o. male presenting with severe alcohol  intoxication, per wife he does not drink regularly but he does not know his limit, there was no other drug use.  No objective signs of trauma.  Blood work reassuring, ethanol level 282.  Patient is slept in the ED and around 5 AM he has ambulated and p.o. challenged.  He declined to call his wife to pick him up at that time because he states that she will not pick up the phone.  Request to sleep for several more hours in the ED.  Evaluation does not show pathology that would require ongoing emergent intervention or inpatient treatment. Pt is hemodynamically stable and mentating appropriately. Discussed findings and plan with patient/guardian, who agrees with care plan. All questions answered. Return precautions discussed and outpatient follow up given.    Final Clinical Impressions(s) / ED Diagnoses   Final diagnoses:  Alcoholic intoxication without complication [facility])    ED Discharge Orders    None       Waynetta Pean 09/26/17 9774    Veryl Speak, MD 09/26/17 [id number]

## 2017-09-27 DIAGNOSIS — S62316D Displaced fracture of base of fifth metacarpal bone, right hand, subsequent encounter for fracture with routine healing: Secondary | ICD-10-CM | POA: Diagnosis not present

## 2017-09-28 ENCOUNTER — Encounter: Payer: Self-pay | Admitting: Internal Medicine

## 2017-09-28 ENCOUNTER — Ambulatory Visit (INDEPENDENT_AMBULATORY_CARE_PROVIDER_SITE_OTHER): Payer: BLUE CROSS/BLUE SHIELD | Admitting: Internal Medicine

## 2017-09-28 VITALS — BP 120/76 | HR 72 | Temp 98.2°F | Wt 229.0 lb

## 2017-09-28 DIAGNOSIS — Z23 Encounter for immunization: Secondary | ICD-10-CM

## 2017-09-28 DIAGNOSIS — F10920 Alcohol use, unspecified with intoxication, uncomplicated: Secondary | ICD-10-CM

## 2017-09-28 NOTE — Progress Notes (Signed)
Subjective:    Patient ID: Gabriel Carroll, male    DOB: [birth year], 51 y.o.   MRN: [id number]  HPI  Pt presents the clinic today for ER follow up. He went to the ER 09/25/17 after attending a [place] party and drinking too much alcohol. His ETOH level was 282. He was treated with IV fluids, Vit B and Folic Acid. Once he slept it off and sobered up, he was discharged home. Since discharge, he reports he is feeling fine. He reports he does not really drink that much, he only had about 4-5 beers that night. He is concerned that something is causing increased absorption of the alcohol into his blood stream. The only other things he is taking is Meloxciam, Advil and Clomid. He has not had any alcohol since his discharge from the ER.  He would like a tetanus shot today.  Review of Systems  Past Medical History:  Diagnosis Date  . Arthritis    OA left shoulder  . Atrial fibrillation ([place])   . Low testosterone   . Obesity (BMI 30-39.9)   . Sleep apnea    no CPAP    Current Outpatient Medications  Medication Sig Dispense Refill  . acetaminophen (TYLENOL) 500 MG tablet Take 500 mg by mouth every 6 (six) hours as needed for mild pain, moderate pain or headache.     . clomiPHENE (CLOMID) 50 MG tablet Take 25 mg by mouth daily.     . flecainide (TAMBOCOR) 150 MG tablet Take 1 tablet (150 mg total) by mouth as needed. (Patient not taking: Reported on 09/26/2017) 10 tablet 1  . meloxicam (MOBIC) 15 MG tablet Take 15 mg by mouth daily.    . metoprolol succinate (TOPROL-XL) 25 MG 24 hr tablet Take 1 tablet (25 mg total) by mouth daily. (Patient not taking: Reported on 09/26/2017) 30 tablet 12   No current facility-administered medications for this visit.     Allergies  Allergen Reactions  . Vancomycin Other (See Comments)    Red man syndrome    Family History  Problem Relation Age of Onset  . Hypertension Mother   . Arthritis Mother   . Hypertension Father   . Cancer Father   . Heart  disease Father   . Diabetes Paternal Aunt   . Diabetes Paternal Uncle   . Diabetes Sister   . Diabetes Brother   . Asthma Maternal Grandmother   . Heart disease Maternal Grandfather   . Diabetes Paternal Grandmother   . Colon cancer Neg Hx   . Esophageal cancer Neg Hx   . Rectal cancer Neg Hx   . Stomach cancer Neg Hx     Social History   Socioeconomic History  . Marital status: Married    Spouse name: Not on file  . Number of children: Not on file  . Years of education: Not on file  . Highest education level: Not on file  Social Needs  . Financial resource strain: Not on file  . Food insecurity - worry: Not on file  . Food insecurity - inability: Not on file  . Transportation needs - medical: Not on file  . Transportation needs - non-medical: Not on file  Occupational History  . Not on file  Tobacco Use  . Smoking status: Never Smoker  . Smokeless tobacco: Never Used  Substance and Sexual Activity  . Alcohol use: Yes    Alcohol/week: 3.0 oz    Types: 5 Cans of beer per week  Comment: occ  . Drug use: No  . Sexual activity: Not on file  Other Topics Concern  . Not on file  Social History Narrative   Married.   2 children.   Works as a Scientist, forensic.   Enjoys mountain biking, brewing beers, hiking.      Constitutional: Denies fever, malaise, fatigue, headache or abrupt weight changes.  Respiratory: Denies difficulty breathing, shortness of breath, cough or sputum production.   Cardiovascular: Denies chest pain, chest tightness, palpitations or swelling in the hands or feet.  Gastrointestinal: Denies abdominal pain, bloating, constipation, diarrhea or blood in the stool.  Neurological: Denies dizziness, difficulty with memory, difficulty with speech or problems with balance and coordination.    No other specific complaints in a complete review of systems (except as listed in HPI above).     Objective:   Physical Exam  BP  120/76   Pulse 72   Temp 98.2 F (36.8 C) (Oral)   Wt 229 lb (103.9 kg)   SpO2 98%   BMI 32.86 kg/m  Wt Readings from Last 3 Encounters:  09/28/17 229 lb (103.9 kg)  02/21/16 230 lb (104.3 kg)  01/31/16 235 lb (106.6 kg)    General: Appears their stated age, well developed, well nourished in NAD. Skin: Warm, dry and intact. No rashes, lesions or ulcerations noted. HEENT: Head: normal shape and size; Eyes: sclera white, no icterus, conjunctiva pink, PERRLA and EOMs intact; Ears: Tm's gray and intact, normal light reflex; Nose: mucosa pink and moist, septum midline; Throat/Mouth: Teeth present, mucosa pink and moist, no exudate, lesions or ulcerations noted.  Neck:  Neck supple, trachea midline. No masses, lumps or thyromegaly present.  Cardiovascular: Normal rate and rhythm. S1,S2 noted.  No murmur, rubs or gallops noted. No JVD or BLE edema. No carotid bruits noted. Pulmonary/Chest: Normal effort and positive vesicular breath sounds. No respiratory distress. No wheezes, rales or ronchi noted.  Abdomen: Soft and nontender. Normal bowel sounds. No distention or masses noted. Liver, spleen and kidneys non palpable. Musculoskeletal: Normal range of motion. No signs of joint swelling. No difficulty with gait.  Neurological: Alert and oriented. Cranial nerves II-XII grossly intact. Coordination normal.  Psychiatric: Mood and affect normal. Behavior is normal. Judgment and thought content normal.   EKG:  BMET    Component Value Date/Time   NA 142 09/26/2017 0028   K 3.6 09/26/2017 0028   CL 109 09/26/2017 0028   CO2 21 (L) 09/26/2017 0028   GLUCOSE 117 (H) 09/26/2017 0028   BUN 17 09/26/2017 0028   CREATININE 1.07 09/26/2017 0028   CALCIUM 9.0 09/26/2017 0028   GFRNONAA >60 09/26/2017 0028   GFRAA >60 09/26/2017 0028    Lipid Panel     Component Value Date/Time   CHOL 144 05/20/2015 0035   TRIG 134 05/20/2015 0035   HDL 30 (L) 05/20/2015 0035   CHOLHDL 4.8 05/20/2015 0035    VLDL 27 05/20/2015 0035   LDLCALC 87 05/20/2015 0035    CBC    Component Value Date/Time   WBC 9.7 09/26/2017 0028   RBC 4.91 09/26/2017 0028   HGB 15.9 09/26/2017 0028   HCT 44.6 09/26/2017 0028   PLT 133 (L) 09/26/2017 0028   MCV 90.8 09/26/2017 0028   MCH 32.4 09/26/2017 0028   MCHC 35.7 09/26/2017 0028   RDW 11.9 09/26/2017 0028   LYMPHSABS 1.2 09/26/2017 0028   MONOABS 0.2 09/26/2017 0028   EOSABS 0.1  09/26/2017 0028   BASOSABS 0.0 09/26/2017 0028    Hgb A1C Lab Results  Component Value Date   HGBA1C 5.0 05/19/2015            Assessment & Plan:   ER Follow up for Alcohol Intoxication:  ER notes and labs reviewed He does not feel like he has an alcohol problem CAGE questionnaire negative No further intervention needed at this time  Return precautions discussed Webb Silversmith, NP

## 2017-09-28 NOTE — Addendum Note (Signed)
Addended by: Lurlean Nanny on: 09/28/2017 02:46 PM   Modules accepted: Orders

## 2017-09-28 NOTE — Patient Instructions (Signed)
Alcohol Intoxication  Alcohol intoxication happens when a person cannot think clearly or function well (becomes impaired) after drinking alcohol. This condition can happen even after one drink. It can be dangerous, especially if:  · The person drank a lot of alcohol in a short amount of time (binge drinking).  · The person also took certain medicines or drugs.    If the intoxication is very bad, a breathing machine (ventilator) may be needed until the alcohol wears off.  Follow these instructions at home:  General instructions   · Do not drive after drinking alcohol.  · Have someone stay with you. You should not be left alone while you have this condition.  · Make sure you have enough fluid in your body. To do this:  ? Drink enough fluid to keep your pee (urine) clear or pale yellow.  ? Avoid caffeine. It can make you thirsty.  · Take over-the-counter and prescription medicines only as told by your doctor.  To prevent this condition:   · Limit alcohol intake to no more than 1 drink a day for nonpregnant women and 2 drinks a day for men. One drink equals 12 oz of beer, 5 oz of wine, or 1½ oz of hard liquor.  · Do not drink alcohol on an empty stomach.  · Avoid drinking alcohol if:  ? You are under the legal drinking age.  ? You are pregnant or may be pregnant.  ? You are taking medicines that you should not take with alcohol.  ? Your drinking causes your medical condition to get worse.  ? You need to drive or do activities that require your attention.  ? You have substance use disorder.  If this condition happens again:   · Get help right away if you or someone you know:  ? Has medium or very bad trouble with:  § Movement (coordination).  § Speech.  § Memory.  § Attention.  ? Passes out (faints).  ? Is confused.  ? Is throwing up (vomiting).  · Do not leave someone with this condition alone.  Contact a doctor if:  · You do not feel better after a few days.  · You are having problems at work, at school, or at home  because of drinking.  Get help right away if:  · You get shaky when you try to stop drinking.  · You start shaking and you cannot control it (have a seizure).  · You throw up blood. The blood may be bright red or look like coffee grounds.  · You see blood in your poop (stool). The blood may:  ? Be bright red.  ? Make your poop black and tarry and make it smell bad.  · You start to feel light-headed.  · You pass out.  If you ever feel like you may hurt yourself or others, or have thoughts about taking your own life, get help right away. You can go to your nearest emergency department or call:  · Your local emergency services (911 in the [place]  · A suicide crisis helpline, such as the National Suicide Prevention Lifeline at 1-800-273-8255. This is open 24 hours a day.    This information is not intended to replace advice given to you by your health care provider. Make sure you discuss any questions you have with your health care provider.  Document Released: 03/16/2008 Document Revised: 06/18/2016 Document Reviewed: 06/18/2016  Elsevier Interactive Patient Education © 2017 Elsevier Inc.

## 2017-10-04 ENCOUNTER — Other Ambulatory Visit: Payer: Self-pay | Admitting: Orthopedic Surgery

## 2017-10-04 DIAGNOSIS — S62316D Displaced fracture of base of fifth metacarpal bone, right hand, subsequent encounter for fracture with routine healing: Secondary | ICD-10-CM

## 2017-10-06 DIAGNOSIS — M7522 Bicipital tendinitis, left shoulder: Secondary | ICD-10-CM | POA: Diagnosis not present

## 2017-10-06 DIAGNOSIS — M7552 Bursitis of left shoulder: Secondary | ICD-10-CM | POA: Diagnosis not present

## 2017-10-06 DIAGNOSIS — M24112 Other articular cartilage disorders, left shoulder: Secondary | ICD-10-CM | POA: Diagnosis not present

## 2017-10-06 DIAGNOSIS — M75120 Complete rotator cuff tear or rupture of unspecified shoulder, not specified as traumatic: Secondary | ICD-10-CM | POA: Diagnosis not present

## 2017-10-06 DIAGNOSIS — Y999 Unspecified external cause status: Secondary | ICD-10-CM | POA: Diagnosis not present

## 2017-10-06 DIAGNOSIS — M19012 Primary osteoarthritis, left shoulder: Secondary | ICD-10-CM | POA: Diagnosis not present

## 2017-10-06 DIAGNOSIS — S43432A Superior glenoid labrum lesion of left shoulder, initial encounter: Secondary | ICD-10-CM | POA: Diagnosis not present

## 2017-10-06 DIAGNOSIS — G8918 Other acute postprocedural pain: Secondary | ICD-10-CM | POA: Diagnosis not present

## 2017-10-06 DIAGNOSIS — X58XXXA Exposure to other specified factors, initial encounter: Secondary | ICD-10-CM | POA: Diagnosis not present

## 2017-10-06 DIAGNOSIS — M7542 Impingement syndrome of left shoulder: Secondary | ICD-10-CM | POA: Diagnosis not present

## 2017-10-06 DIAGNOSIS — M7582 Other shoulder lesions, left shoulder: Secondary | ICD-10-CM | POA: Diagnosis not present

## 2017-10-08 ENCOUNTER — Ambulatory Visit
Admission: RE | Admit: 2017-10-08 | Discharge: 2017-10-08 | Disposition: A | Discharge status: Home / Self Care | Payer: BLUE CROSS/BLUE SHIELD | Source: Ambulatory Visit | Attending: Orthopedic Surgery | Admitting: Orthopedic Surgery

## 2017-10-08 DIAGNOSIS — S62316A Displaced fracture of base of fifth metacarpal bone, right hand, initial encounter for closed fracture: Secondary | ICD-10-CM | POA: Diagnosis not present

## 2017-10-08 DIAGNOSIS — S62316D Displaced fracture of base of fifth metacarpal bone, right hand, subsequent encounter for fracture with routine healing: Secondary | ICD-10-CM

## 2017-10-15 DIAGNOSIS — M7522 Bicipital tendinitis, left shoulder: Secondary | ICD-10-CM | POA: Diagnosis not present

## 2017-10-18 DIAGNOSIS — M25512 Pain in left shoulder: Secondary | ICD-10-CM | POA: Diagnosis not present

## 2017-10-18 DIAGNOSIS — S46112D Strain of muscle, fascia and tendon of long head of biceps, left arm, subsequent encounter: Secondary | ICD-10-CM | POA: Diagnosis not present

## 2017-10-21 DIAGNOSIS — S46112D Strain of muscle, fascia and tendon of long head of biceps, left arm, subsequent encounter: Secondary | ICD-10-CM | POA: Diagnosis not present

## 2017-10-21 DIAGNOSIS — M25512 Pain in left shoulder: Secondary | ICD-10-CM | POA: Diagnosis not present

## 2017-10-25 DIAGNOSIS — S46112D Strain of muscle, fascia and tendon of long head of biceps, left arm, subsequent encounter: Secondary | ICD-10-CM | POA: Diagnosis not present

## 2017-10-25 DIAGNOSIS — M25512 Pain in left shoulder: Secondary | ICD-10-CM | POA: Diagnosis not present

## 2017-10-28 DIAGNOSIS — M25512 Pain in left shoulder: Secondary | ICD-10-CM | POA: Diagnosis not present

## 2017-10-28 DIAGNOSIS — S46112D Strain of muscle, fascia and tendon of long head of biceps, left arm, subsequent encounter: Secondary | ICD-10-CM | POA: Diagnosis not present

## 2017-11-01 DIAGNOSIS — M25512 Pain in left shoulder: Secondary | ICD-10-CM | POA: Diagnosis not present

## 2017-11-01 DIAGNOSIS — S46112D Strain of muscle, fascia and tendon of long head of biceps, left arm, subsequent encounter: Secondary | ICD-10-CM | POA: Diagnosis not present

## 2017-11-04 DIAGNOSIS — M25512 Pain in left shoulder: Secondary | ICD-10-CM | POA: Diagnosis not present

## 2017-11-04 DIAGNOSIS — S46112D Strain of muscle, fascia and tendon of long head of biceps, left arm, subsequent encounter: Secondary | ICD-10-CM | POA: Diagnosis not present

## 2017-11-09 DIAGNOSIS — S46112D Strain of muscle, fascia and tendon of long head of biceps, left arm, subsequent encounter: Secondary | ICD-10-CM | POA: Diagnosis not present

## 2017-11-09 DIAGNOSIS — M25512 Pain in left shoulder: Secondary | ICD-10-CM | POA: Diagnosis not present

## 2017-11-11 DIAGNOSIS — M25512 Pain in left shoulder: Secondary | ICD-10-CM | POA: Diagnosis not present

## 2017-11-11 DIAGNOSIS — S46112D Strain of muscle, fascia and tendon of long head of biceps, left arm, subsequent encounter: Secondary | ICD-10-CM | POA: Diagnosis not present

## 2017-11-15 DIAGNOSIS — M25512 Pain in left shoulder: Secondary | ICD-10-CM | POA: Diagnosis not present

## 2017-11-15 DIAGNOSIS — S46112D Strain of muscle, fascia and tendon of long head of biceps, left arm, subsequent encounter: Secondary | ICD-10-CM | POA: Diagnosis not present

## 2017-11-18 DIAGNOSIS — S46112D Strain of muscle, fascia and tendon of long head of biceps, left arm, subsequent encounter: Secondary | ICD-10-CM | POA: Diagnosis not present

## 2017-11-18 DIAGNOSIS — M25512 Pain in left shoulder: Secondary | ICD-10-CM | POA: Diagnosis not present

## 2017-11-23 DIAGNOSIS — S46112D Strain of muscle, fascia and tendon of long head of biceps, left arm, subsequent encounter: Secondary | ICD-10-CM | POA: Diagnosis not present

## 2017-11-23 DIAGNOSIS — M25512 Pain in left shoulder: Secondary | ICD-10-CM | POA: Diagnosis not present

## 2017-11-26 DIAGNOSIS — S46112D Strain of muscle, fascia and tendon of long head of biceps, left arm, subsequent encounter: Secondary | ICD-10-CM | POA: Diagnosis not present

## 2017-11-26 DIAGNOSIS — M25512 Pain in left shoulder: Secondary | ICD-10-CM | POA: Diagnosis not present

## 2017-11-30 DIAGNOSIS — S46112D Strain of muscle, fascia and tendon of long head of biceps, left arm, subsequent encounter: Secondary | ICD-10-CM | POA: Diagnosis not present

## 2017-11-30 DIAGNOSIS — M25512 Pain in left shoulder: Secondary | ICD-10-CM | POA: Diagnosis not present

## 2017-12-28 DIAGNOSIS — L570 Actinic keratosis: Secondary | ICD-10-CM | POA: Diagnosis not present

## 2017-12-31 DIAGNOSIS — M25512 Pain in left shoulder: Secondary | ICD-10-CM | POA: Diagnosis not present

## 2018-01-21 ENCOUNTER — Encounter: Payer: Self-pay | Admitting: Cardiology

## 2018-01-21 DIAGNOSIS — R079 Chest pain, unspecified: Secondary | ICD-10-CM | POA: Diagnosis not present

## 2018-02-22 DIAGNOSIS — S20461A Insect bite (nonvenomous) of right back wall of thorax, initial encounter: Secondary | ICD-10-CM | POA: Diagnosis not present

## 2018-02-22 DIAGNOSIS — L578 Other skin changes due to chronic exposure to nonionizing radiation: Secondary | ICD-10-CM | POA: Diagnosis not present

## 2018-03-01 DIAGNOSIS — E668 Other obesity: Secondary | ICD-10-CM | POA: Diagnosis not present

## 2018-03-01 DIAGNOSIS — G4733 Obstructive sleep apnea (adult) (pediatric): Secondary | ICD-10-CM | POA: Diagnosis not present

## 2018-03-01 DIAGNOSIS — I480 Paroxysmal atrial fibrillation: Secondary | ICD-10-CM | POA: Diagnosis not present

## 2018-03-08 DIAGNOSIS — R079 Chest pain, unspecified: Secondary | ICD-10-CM | POA: Diagnosis not present

## 2018-03-17 DIAGNOSIS — H9042 Sensorineural hearing loss, unilateral, left ear, with unrestricted hearing on the contralateral side: Secondary | ICD-10-CM | POA: Diagnosis not present

## 2018-03-17 DIAGNOSIS — H9312 Tinnitus, left ear: Secondary | ICD-10-CM | POA: Diagnosis not present

## 2018-03-18 DIAGNOSIS — E291 Testicular hypofunction: Secondary | ICD-10-CM | POA: Diagnosis not present

## 2018-03-18 DIAGNOSIS — N400 Enlarged prostate without lower urinary tract symptoms: Secondary | ICD-10-CM | POA: Diagnosis not present

## 2018-03-18 DIAGNOSIS — R972 Elevated prostate specific antigen [PSA]: Secondary | ICD-10-CM | POA: Diagnosis not present

## 2018-03-30 ENCOUNTER — Other Ambulatory Visit: Payer: Self-pay | Admitting: Primary Care

## 2018-03-30 DIAGNOSIS — Z125 Encounter for screening for malignant neoplasm of prostate: Secondary | ICD-10-CM

## 2018-03-30 DIAGNOSIS — Z0000 Encounter for general adult medical examination without abnormal findings: Secondary | ICD-10-CM

## 2018-04-20 ENCOUNTER — Other Ambulatory Visit (INDEPENDENT_AMBULATORY_CARE_PROVIDER_SITE_OTHER): Payer: BLUE CROSS/BLUE SHIELD

## 2018-04-20 DIAGNOSIS — Z0000 Encounter for general adult medical examination without abnormal findings: Secondary | ICD-10-CM | POA: Diagnosis not present

## 2018-04-20 DIAGNOSIS — Z125 Encounter for screening for malignant neoplasm of prostate: Secondary | ICD-10-CM

## 2018-04-20 LAB — LIPID PANEL
CHOLESTEROL: 172 mg/dL (ref 0–200)
HDL: 45.9 mg/dL (ref >39.00)
LDL Cholesterol: 111 mg/dL — ABNORMAL HIGH (ref 0–99)
NonHDL: 125.66
Total CHOL/HDL Ratio: 4
Triglycerides: 72 mg/dL (ref 0.0–149.0)
VLDL: 14.4 mg/dL (ref 0.0–40.0)

## 2018-04-20 LAB — COMPREHENSIVE METABOLIC PANEL
ALBUMIN: 4.6 g/dL (ref 3.5–5.2)
ALT: 28 U/L (ref 0–53)
AST: 25 U/L (ref 0–37)
Alkaline Phosphatase: 76 U/L (ref 39–117)
BUN: 20 mg/dL (ref 6–23)
CO2: 32 mEq/L (ref 19–32)
CREATININE: 0.9 mg/dL (ref 0.40–1.50)
Calcium: 9.7 mg/dL (ref 8.4–10.5)
Chloride: 103 mEq/L (ref 96–112)
GFR: 93.64 mL/min (ref >60.00)
Glucose, Bld: 101 mg/dL — ABNORMAL HIGH (ref 70–99)
POTASSIUM: 4.6 meq/L (ref 3.5–5.1)
SODIUM: 140 meq/L (ref 135–145)
Total Bilirubin: 0.6 mg/dL (ref 0.2–1.2)
Total Protein: 7.1 g/dL (ref 6.0–8.3)

## 2018-04-20 LAB — PSA: PSA: 2.87 ng/mL (ref 0.10–4.00)

## 2018-04-26 ENCOUNTER — Ambulatory Visit (INDEPENDENT_AMBULATORY_CARE_PROVIDER_SITE_OTHER): Payer: BLUE CROSS/BLUE SHIELD | Admitting: Primary Care

## 2018-04-26 ENCOUNTER — Encounter

## 2018-04-26 ENCOUNTER — Encounter: Payer: Self-pay | Admitting: Primary Care

## 2018-04-26 VITALS — BP 118/74 | HR 55 | Temp 97.8°F | Ht 70.0 in | Wt 217.8 lb

## 2018-04-26 DIAGNOSIS — R739 Hyperglycemia, unspecified: Secondary | ICD-10-CM | POA: Diagnosis not present

## 2018-04-26 DIAGNOSIS — E669 Obesity, unspecified: Secondary | ICD-10-CM | POA: Diagnosis not present

## 2018-04-26 DIAGNOSIS — R7989 Other specified abnormal findings of blood chemistry: Secondary | ICD-10-CM | POA: Diagnosis not present

## 2018-04-26 DIAGNOSIS — I480 Paroxysmal atrial fibrillation: Secondary | ICD-10-CM | POA: Diagnosis not present

## 2018-04-26 DIAGNOSIS — G4730 Sleep apnea, unspecified: Secondary | ICD-10-CM | POA: Diagnosis not present

## 2018-04-26 DIAGNOSIS — Z0000 Encounter for general adult medical examination without abnormal findings: Secondary | ICD-10-CM

## 2018-04-26 LAB — POCT GLYCOSYLATED HEMOGLOBIN (HGB A1C): Hemoglobin A1C: 5 % (ref 4.0–5.6)

## 2018-04-26 NOTE — Progress Notes (Signed)
Subjective:    Patient ID: Gabriel Carroll, male    DOB: Aug 21, 1966, 52 y.o.   MRN: [id number]  HPI  Gabriel Carroll is a 52 year old male who presents today for complete physical.  Underwent left rotator cuff surgery in December 2018, overall feels better but not 100% better. Does struggle with reduced strength.   Admitted to a hospital in [place] in April 2019 for complaints of chest pain. Underwent cardiac catheterization which revealed small vessel disease, thinks this was to the left side. Since then he denies chest pain.  Immunizations: -Tetanus: Completed in 2018 -Influenza: Did not complete last season   Diet: He endorses a healthy diet Breakfast: Fruit, egg Lunch: Salad with protein, left overs Dinner: Steak, chicken, tuna, vegetables Snacks: Veggies Desserts: Occasionally, 1 weekly Beverages: V8 juice  Exercise: He is running 3 miles 5-6 days weekly, or walking Eye exam: Completed in January 2019 Dental exam: Completes semi-annually  Colonoscopy: Completed in 2017, due in 2022 PSA: Normal in 2019  BP Readings from Last 3 Encounters:  04/26/18 118/74  09/28/17 120/76  09/26/17 121/86     Review of Systems  Constitutional: Negative for unexpected weight change.  HENT: Negative for rhinorrhea.   Respiratory: Negative for cough and shortness of breath.   Cardiovascular: Negative for chest pain.  Gastrointestinal: Negative for constipation and diarrhea.  Genitourinary: Negative for difficulty urinating.  Musculoskeletal: Negative for arthralgias and myalgias.  Skin: Negative for rash.  Allergic/Immunologic: Negative for environmental allergies.  Neurological: Negative for dizziness, numbness and headaches.  Psychiatric/Behavioral: The patient is not nervous/anxious.        Past Medical History:  Diagnosis Date  . Arthritis    OA left shoulder  . Atrial fibrillation ([place])   . Low testosterone   . Obesity (BMI 30-39.9)   . Sleep apnea    no CPAP     Social  History   Socioeconomic History  . Marital status: Married    Spouse name: Not on file  . Number of children: Not on file  . Years of education: Not on file  . Highest education level: Not on file  Occupational History  . Not on file  Social Needs  . Financial resource strain: Not on file  . Food insecurity:    Worry: Not on file    Inability: Not on file  . Transportation needs:    Medical: Not on file    Non-medical: Not on file  Tobacco Use  . Smoking status: Never Smoker  . Smokeless tobacco: Never Used  Substance and Sexual Activity  . Alcohol use: Yes    Alcohol/week: 3.0 oz    Types: 5 Cans of beer per week    Comment: occ  . Drug use: No  . Sexual activity: Not on file  Lifestyle  . Physical activity:    Days per week: Not on file    Minutes per session: Not on file  . Stress: Not on file  Relationships  . Social connections:    Talks on phone: Not on file    Gets together: Not on file    Attends religious service: Not on file    Active member of club or organization: Not on file    Attends meetings of clubs or organizations: Not on file    Relationship status: Not on file  . Intimate partner violence:    Fear of current or ex partner: Not on file    Emotionally abused: Not on file  Physically abused: Not on file    Forced sexual activity: Not on file  Other Topics Concern  . Not on file  Social History Narrative   Married.   2 children.   Works as a Scientist, forensic.   Enjoys mountain biking, brewing beers, hiking.     Past Surgical History:  Procedure Laterality Date  . BUNIONECTOMY    . CHOLECYSTECTOMY    . KNEE CARTILAGE SURGERY     lt  . liver doner    . lung surgey    . shoulder surgery rt      Family History  Problem Relation Age of Onset  . Hypertension Mother   . Arthritis Mother   . Hypertension Father   . Cancer Father   . Heart disease Father   . Diabetes Paternal Aunt   . Diabetes Paternal  Uncle   . Diabetes Sister   . Diabetes Brother   . Asthma Maternal Grandmother   . Heart disease Maternal Grandfather   . Diabetes Paternal Grandmother   . Colon cancer Neg Hx   . Esophageal cancer Neg Hx   . Rectal cancer Neg Hx   . Stomach cancer Neg Hx     Allergies  Allergen Reactions  . Vancomycin Other (See Comments)    Red man syndrome    Current Outpatient Medications on File Prior to Visit  Medication Sig Dispense Refill  . acetaminophen (TYLENOL) 500 MG tablet Take 500 mg by mouth every 6 (six) hours as needed for mild pain, moderate pain or headache.     . clomiPHENE (CLOMID) 50 MG tablet Take 25 mg by mouth daily.     Marland Kitchen ibuprofen (ADVIL,MOTRIN) 200 MG tablet Take 400 mg by mouth daily as needed.    . flecainide (TAMBOCOR) 150 MG tablet Take 1 tablet (150 mg total) by mouth as needed. (Patient not taking: Reported on 04/26/2018) 10 tablet 1  . metoprolol succinate (TOPROL-XL) 25 MG 24 hr tablet Take 1 tablet (25 mg total) by mouth daily. (Patient not taking: Reported on 04/26/2018) 30 tablet 12   No current facility-administered medications on file prior to visit.     BP 118/74   Pulse (!) 55   Temp 97.8 F (36.6 C) (Oral)   Ht 5\' 10"  (1.778 m)   Wt 217 lb 12 oz (98.8 kg)   SpO2 97%   BMI 31.24 kg/m    Objective:   Physical Exam  Constitutional: He is oriented to person, place, and time. He appears well-nourished.  HENT:  Mouth/Throat: No oropharyngeal exudate.  Eyes: Pupils are equal, round, and reactive to light. EOM are normal.  Neck: Neck supple. No thyromegaly present.  Cardiovascular: Normal rate and regular rhythm.  Respiratory: Effort normal and breath sounds normal.  GI: Soft. Bowel sounds are normal. There is no tenderness.  Musculoskeletal: Normal range of motion.  Neurological: He is alert and oriented to person, place, and time.  Skin: Skin is warm and dry.  Psychiatric: He has a normal mood and affect.           Assessment & Plan:

## 2018-04-26 NOTE — Assessment & Plan Note (Signed)
Following with Urology, stable on Clomid. BP stable in the office today. PSA unremarkable.

## 2018-04-26 NOTE — Assessment & Plan Note (Signed)
Commended him on a healthy diet and regular exercise, encouraged him to continue.

## 2018-04-26 NOTE — Patient Instructions (Signed)
Stop by the lab prior to leaving today. I will notify you of your results once received.   Continue exercising. You should be getting 150 minutes of moderate intensity exercise weekly.  Continue to work on a healthy diet as discussed. Make sure to eat plenty of vegetables, fruit, whole grains, lean protein.  Ensure you are consuming 64 ounces of water daily.  Follow up in 1 year for your annual exam or sooner if needed.  It was a pleasure to see you today!   Preventive Care 40-64 Years, Male Preventive care refers to lifestyle choices and visits with your health care provider that can promote health and wellness. What does preventive care include?  A yearly physical exam. This is also called an annual well check.  Dental exams once or twice a year.  Routine eye exams. Ask your health care provider how often you should have your eyes checked.  Personal lifestyle choices, including: ? Daily care of your teeth and gums. ? Regular physical activity. ? Eating a healthy diet. ? Avoiding tobacco and drug use. ? Limiting alcohol use. ? Practicing safe sex. ? Taking low-dose aspirin every day starting at age 70. What happens during an annual well check? The services and screenings done by your health care provider during your annual well check will depend on your age, overall health, lifestyle risk factors, and family history of disease. Counseling Your health care provider may ask you questions about your:  Alcohol use.  Tobacco use.  Drug use.  Emotional well-being.  Home and relationship well-being.  Sexual activity.  Eating habits.  Work and work Statistician.  Screening You may have the following tests or measurements:  Height, weight, and BMI.  Blood pressure.  Lipid and cholesterol levels. These may be checked every 5 years, or more frequently if you are over 82 years old.  Skin check.  Lung cancer screening. You may have this screening every year starting  at age 82 if you have a 30-pack-year history of smoking and currently smoke or have quit within the past 15 years.  Fecal occult blood test (FOBT) of the stool. You may have this test every year starting at age 68.  Flexible sigmoidoscopy or colonoscopy. You may have a sigmoidoscopy every 5 years or a colonoscopy every 10 years starting at age 17.  Prostate cancer screening. Recommendations will vary depending on your family history and other risks.  Hepatitis C blood test.  Hepatitis B blood test.  Sexually transmitted disease (STD) testing.  Diabetes screening. This is done by checking your blood sugar (glucose) after you have not eaten for a while (fasting). You may have this done every 1-3 years.  Discuss your test results, treatment options, and if necessary, the need for more tests with your health care provider. Vaccines Your health care provider may recommend certain vaccines, such as:  Influenza vaccine. This is recommended every year.  Tetanus, diphtheria, and acellular pertussis (Tdap, Td) vaccine. You may need a Td booster every 10 years.  Varicella vaccine. You may need this if you have not been vaccinated.  Zoster vaccine. You may need this after age 41.  Measles, mumps, and rubella (MMR) vaccine. You may need at least one dose of MMR if you were born in 1957 or later. You may also need a second dose.  Pneumococcal 13-valent conjugate (PCV13) vaccine. You may need this if you have certain conditions and have not been vaccinated.  Pneumococcal polysaccharide (PPSV23) vaccine. You may need one or  two doses if you smoke cigarettes or if you have certain conditions.  Meningococcal vaccine. You may need this if you have certain conditions.  Hepatitis A vaccine. You may need this if you have certain conditions or if you travel or work in places where you may be exposed to hepatitis A.  Hepatitis B vaccine. You may need this if you have certain conditions or if you travel  or work in places where you may be exposed to hepatitis B.  Haemophilus influenzae type b (Hib) vaccine. You may need this if you have certain risk factors.  Talk to your health care provider about which screenings and vaccines you need and how often you need them. This information is not intended to replace advice given to you by your health care provider. Make sure you discuss any questions you have with your health care provider. Document Released: 10/25/2015 Document Revised: 06/17/2016 Document Reviewed: 07/30/2015 Elsevier Interactive Patient Education  [employer].

## 2018-04-26 NOTE — Assessment & Plan Note (Signed)
Following with cardiology, using flecainide and metoprolol succinate PRN.

## 2018-04-26 NOTE — Assessment & Plan Note (Signed)
Td UTD. Colonoscopy UTD, due in 2022. PSA UTD. Discussed to continue to work on a healthy diet, continue regular exercise. Exam unremarkable. Labs with mild hyperglycemia, POC A1C pending. Follow up in 1 year for CPE.

## 2018-04-26 NOTE — Assessment & Plan Note (Signed)
Has never used CPAP machine.

## 2018-06-03 DIAGNOSIS — M5386 Other specified dorsopathies, lumbar region: Secondary | ICD-10-CM | POA: Diagnosis not present

## 2018-06-03 DIAGNOSIS — M5137 Other intervertebral disc degeneration, lumbosacral region: Secondary | ICD-10-CM | POA: Diagnosis not present

## 2018-06-03 DIAGNOSIS — M9903 Segmental and somatic dysfunction of lumbar region: Secondary | ICD-10-CM | POA: Diagnosis not present

## 2018-06-03 DIAGNOSIS — M9905 Segmental and somatic dysfunction of pelvic region: Secondary | ICD-10-CM | POA: Diagnosis not present

## 2018-06-07 DIAGNOSIS — M5386 Other specified dorsopathies, lumbar region: Secondary | ICD-10-CM | POA: Diagnosis not present

## 2018-06-07 DIAGNOSIS — M9903 Segmental and somatic dysfunction of lumbar region: Secondary | ICD-10-CM | POA: Diagnosis not present

## 2018-06-07 DIAGNOSIS — M5137 Other intervertebral disc degeneration, lumbosacral region: Secondary | ICD-10-CM | POA: Diagnosis not present

## 2018-06-07 DIAGNOSIS — M9905 Segmental and somatic dysfunction of pelvic region: Secondary | ICD-10-CM | POA: Diagnosis not present

## 2018-06-10 DIAGNOSIS — M5137 Other intervertebral disc degeneration, lumbosacral region: Secondary | ICD-10-CM | POA: Diagnosis not present

## 2018-06-10 DIAGNOSIS — M9905 Segmental and somatic dysfunction of pelvic region: Secondary | ICD-10-CM | POA: Diagnosis not present

## 2018-06-10 DIAGNOSIS — M9903 Segmental and somatic dysfunction of lumbar region: Secondary | ICD-10-CM | POA: Diagnosis not present

## 2018-06-10 DIAGNOSIS — M5386 Other specified dorsopathies, lumbar region: Secondary | ICD-10-CM | POA: Diagnosis not present

## 2018-06-14 DIAGNOSIS — M9903 Segmental and somatic dysfunction of lumbar region: Secondary | ICD-10-CM | POA: Diagnosis not present

## 2018-06-14 DIAGNOSIS — M9905 Segmental and somatic dysfunction of pelvic region: Secondary | ICD-10-CM | POA: Diagnosis not present

## 2018-06-14 DIAGNOSIS — M5137 Other intervertebral disc degeneration, lumbosacral region: Secondary | ICD-10-CM | POA: Diagnosis not present

## 2018-06-14 DIAGNOSIS — M5386 Other specified dorsopathies, lumbar region: Secondary | ICD-10-CM | POA: Diagnosis not present

## 2018-06-17 DIAGNOSIS — M9903 Segmental and somatic dysfunction of lumbar region: Secondary | ICD-10-CM | POA: Diagnosis not present

## 2018-06-17 DIAGNOSIS — M5137 Other intervertebral disc degeneration, lumbosacral region: Secondary | ICD-10-CM | POA: Diagnosis not present

## 2018-06-17 DIAGNOSIS — M9905 Segmental and somatic dysfunction of pelvic region: Secondary | ICD-10-CM | POA: Diagnosis not present

## 2018-06-17 DIAGNOSIS — M5386 Other specified dorsopathies, lumbar region: Secondary | ICD-10-CM | POA: Diagnosis not present

## 2018-06-21 DIAGNOSIS — M9903 Segmental and somatic dysfunction of lumbar region: Secondary | ICD-10-CM | POA: Diagnosis not present

## 2018-06-21 DIAGNOSIS — M5137 Other intervertebral disc degeneration, lumbosacral region: Secondary | ICD-10-CM | POA: Diagnosis not present

## 2018-06-21 DIAGNOSIS — M5386 Other specified dorsopathies, lumbar region: Secondary | ICD-10-CM | POA: Diagnosis not present

## 2018-06-21 DIAGNOSIS — M9905 Segmental and somatic dysfunction of pelvic region: Secondary | ICD-10-CM | POA: Diagnosis not present

## 2018-06-24 DIAGNOSIS — M9903 Segmental and somatic dysfunction of lumbar region: Secondary | ICD-10-CM | POA: Diagnosis not present

## 2018-06-24 DIAGNOSIS — M5386 Other specified dorsopathies, lumbar region: Secondary | ICD-10-CM | POA: Diagnosis not present

## 2018-06-24 DIAGNOSIS — M9905 Segmental and somatic dysfunction of pelvic region: Secondary | ICD-10-CM | POA: Diagnosis not present

## 2018-06-24 DIAGNOSIS — M5137 Other intervertebral disc degeneration, lumbosacral region: Secondary | ICD-10-CM | POA: Diagnosis not present

## 2018-06-24 IMAGING — CT CT HAND*R* W/O CM
5 of 7 series · 15 of 34 positions shown, 16 images · non-contrast
Comparison: None.

CLINICAL DATA: Fracture of the fifth metacarpal.

EXAM:
CT OF THE RIGHT HAND WITHOUT CONTRAST
TECHNIQUE: Multidetector CT imaging of the right hand was performed according
to the standard protocol. Multiplanar CT image reconstructions were
also generated.

[Series 5: upper ext soft · axial · 0.44mm/px · z∈[-16,+112]mm · 3 of 103 slices shown]
[im 26/103  soft-tissue]
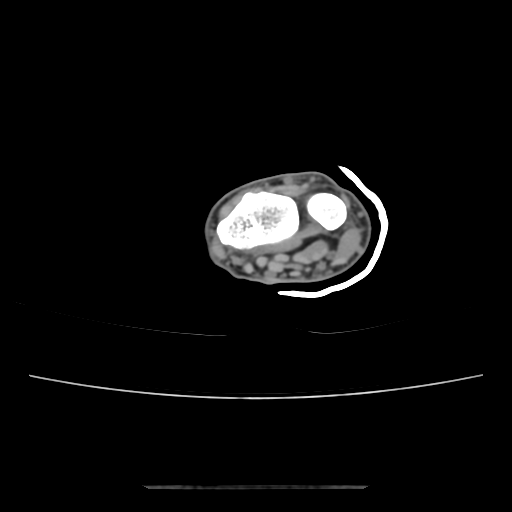
[im 52/103  soft-tissue]
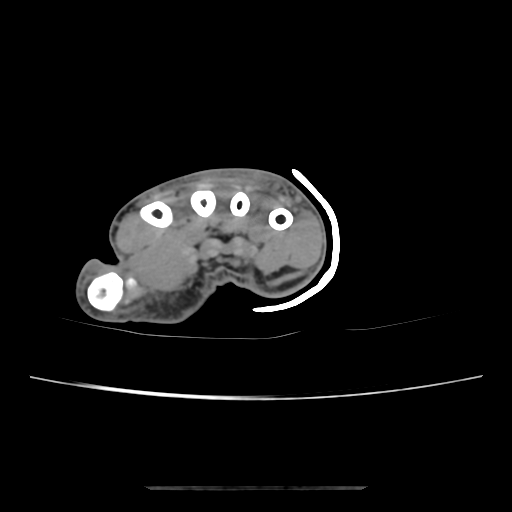
[im 77/103  soft-tissue]
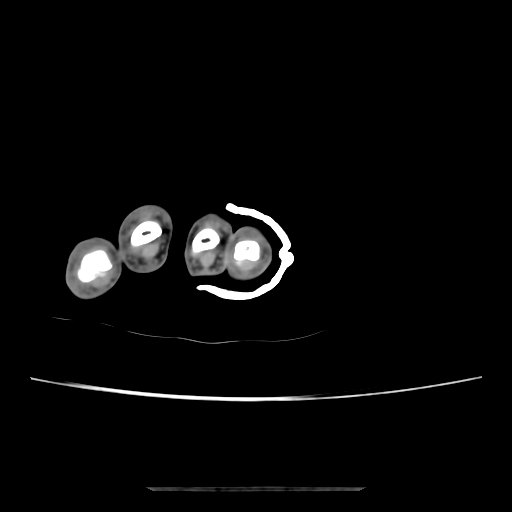

[Series 200: cor bone · coronal · 0.44mm/px · 1 of 55 slices shown]
[im 28/55  bone]
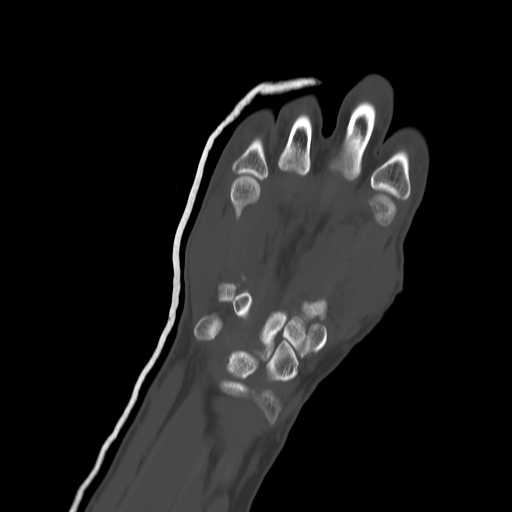

[Series 202: axial bone · axial · 0.51mm/px · z∈[+45,+142]mm · 3 of 108 slices shown, 4 images]
[im 27/108  soft-tissue]
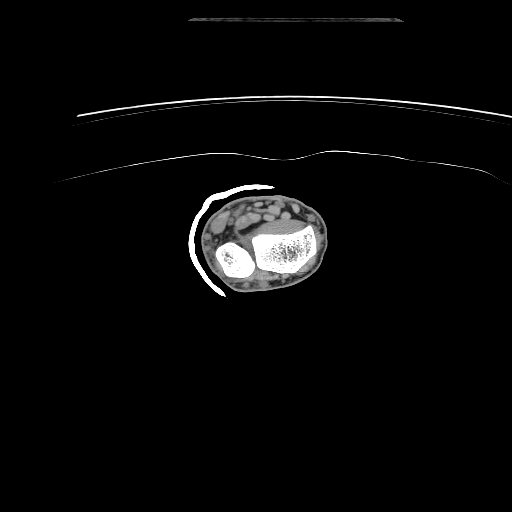
[im 27/108  bone]
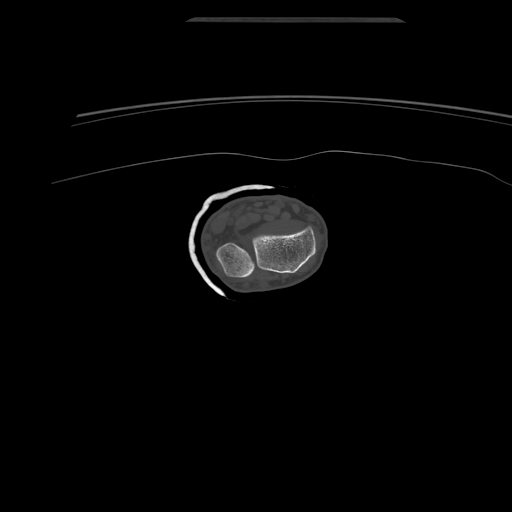
[im 54/108  bone]
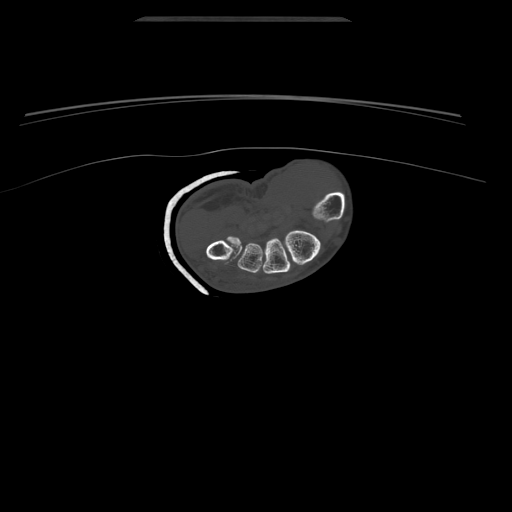
[im 81/108  bone]
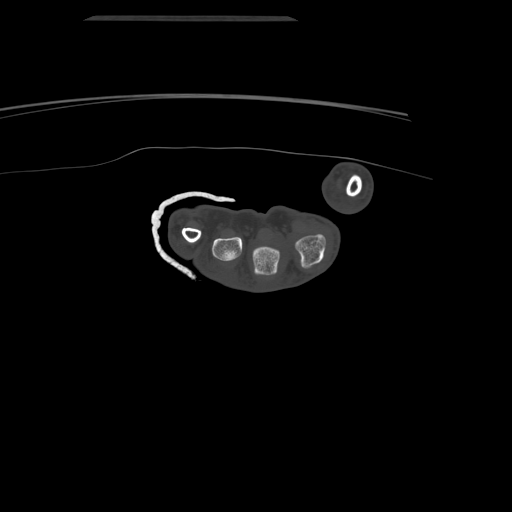

[Series 301: sag st · sagittal · 0.51mm/px · 6 of 80 slices shown]
[im 17/80  bone]
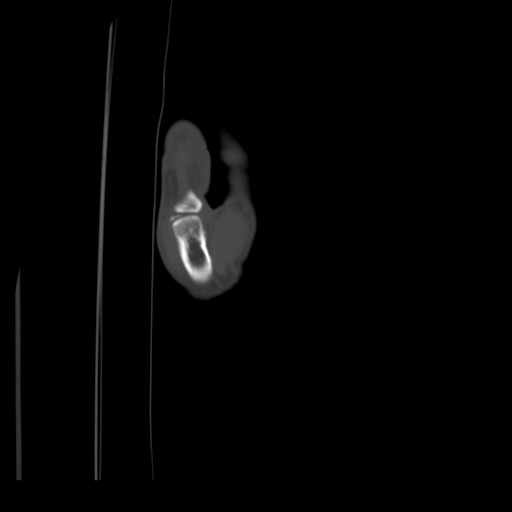
[im 32/80  bone]
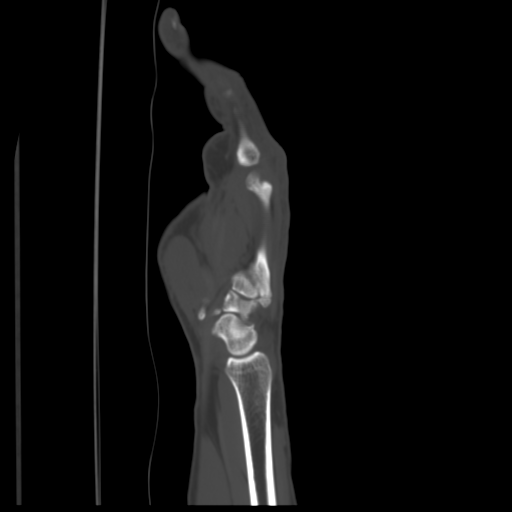
[im 34/80  soft-tissue]
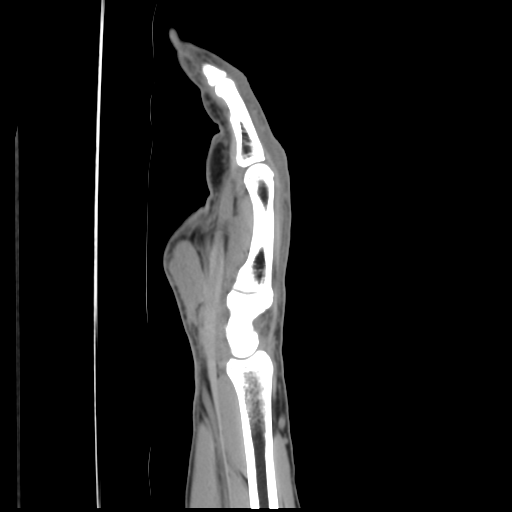
[im 48/80  bone]
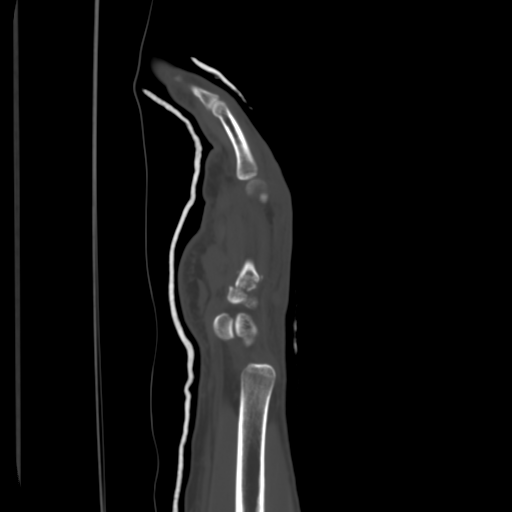
[im 63/80  bone]
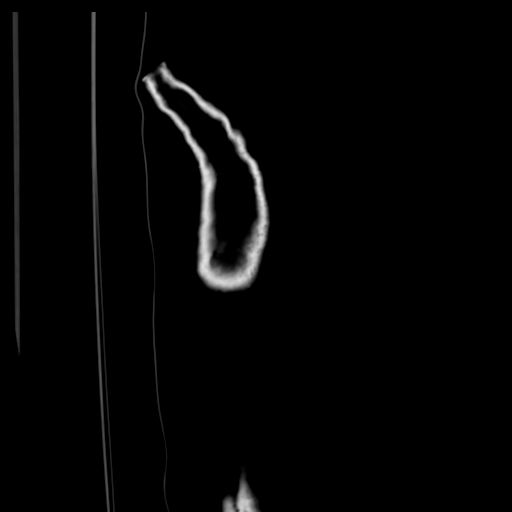
[im 79/80  bone]
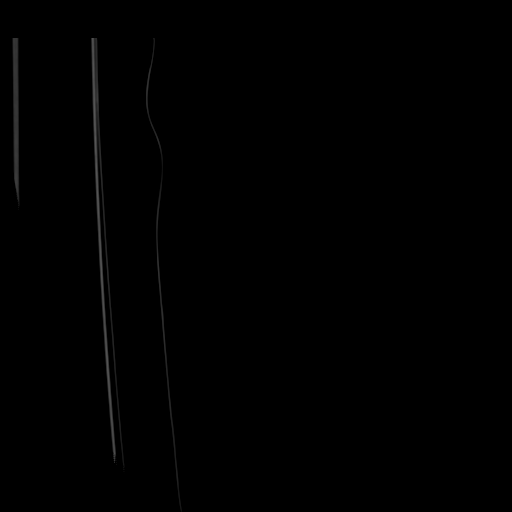

[Series 302: axial st · axial · 0.51mm/px · z∈[+62,+123]mm · 2 of 99 slices shown]
[im 33/99  bone]
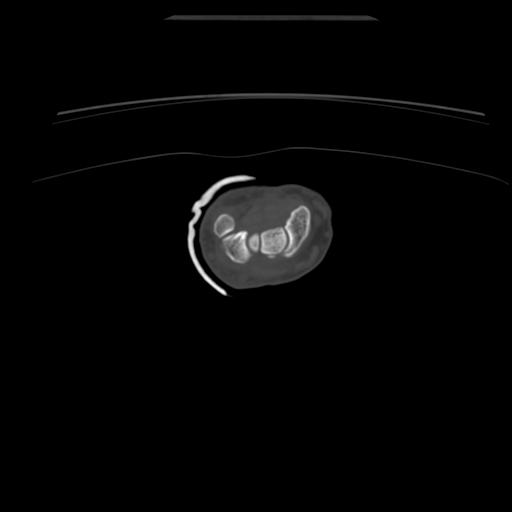
[im 66/99  bone]
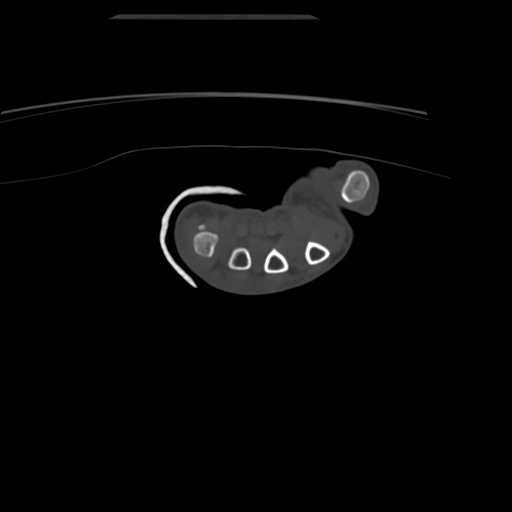

[15 of 34 positions shown; findings below may reference images not displayed]

FINDINGS: Bones/Joint/Cartilage

There is a comminuted impacted fracture of the base of the fifth
metacarpal. Fracture involves the articular surface. The volar
fragment is slightly displaced with respect to the hamate, best seen
on images 28 and 29 of series 200.

The bones otherwise are intact.
IMPRESSION: Impacted comminuted fracture of the base of the fifth metacarpal as
described.

## 2018-06-28 DIAGNOSIS — M9903 Segmental and somatic dysfunction of lumbar region: Secondary | ICD-10-CM | POA: Diagnosis not present

## 2018-06-28 DIAGNOSIS — M9905 Segmental and somatic dysfunction of pelvic region: Secondary | ICD-10-CM | POA: Diagnosis not present

## 2018-06-28 DIAGNOSIS — M5386 Other specified dorsopathies, lumbar region: Secondary | ICD-10-CM | POA: Diagnosis not present

## 2018-06-28 DIAGNOSIS — M5137 Other intervertebral disc degeneration, lumbosacral region: Secondary | ICD-10-CM | POA: Diagnosis not present

## 2018-07-01 DIAGNOSIS — M9903 Segmental and somatic dysfunction of lumbar region: Secondary | ICD-10-CM | POA: Diagnosis not present

## 2018-07-01 DIAGNOSIS — M5386 Other specified dorsopathies, lumbar region: Secondary | ICD-10-CM | POA: Diagnosis not present

## 2018-07-01 DIAGNOSIS — M5137 Other intervertebral disc degeneration, lumbosacral region: Secondary | ICD-10-CM | POA: Diagnosis not present

## 2018-07-01 DIAGNOSIS — M9905 Segmental and somatic dysfunction of pelvic region: Secondary | ICD-10-CM | POA: Diagnosis not present

## 2018-07-07 DIAGNOSIS — M9905 Segmental and somatic dysfunction of pelvic region: Secondary | ICD-10-CM | POA: Diagnosis not present

## 2018-07-07 DIAGNOSIS — M9903 Segmental and somatic dysfunction of lumbar region: Secondary | ICD-10-CM | POA: Diagnosis not present

## 2018-07-07 DIAGNOSIS — M5137 Other intervertebral disc degeneration, lumbosacral region: Secondary | ICD-10-CM | POA: Diagnosis not present

## 2018-07-07 DIAGNOSIS — M5386 Other specified dorsopathies, lumbar region: Secondary | ICD-10-CM | POA: Diagnosis not present

## 2018-07-13 DIAGNOSIS — M5137 Other intervertebral disc degeneration, lumbosacral region: Secondary | ICD-10-CM | POA: Diagnosis not present

## 2018-07-13 DIAGNOSIS — M9905 Segmental and somatic dysfunction of pelvic region: Secondary | ICD-10-CM | POA: Diagnosis not present

## 2018-07-13 DIAGNOSIS — M9903 Segmental and somatic dysfunction of lumbar region: Secondary | ICD-10-CM | POA: Diagnosis not present

## 2018-07-13 DIAGNOSIS — M5386 Other specified dorsopathies, lumbar region: Secondary | ICD-10-CM | POA: Diagnosis not present

## 2018-07-21 DIAGNOSIS — M5386 Other specified dorsopathies, lumbar region: Secondary | ICD-10-CM | POA: Diagnosis not present

## 2018-07-21 DIAGNOSIS — M9905 Segmental and somatic dysfunction of pelvic region: Secondary | ICD-10-CM | POA: Diagnosis not present

## 2018-07-21 DIAGNOSIS — M9903 Segmental and somatic dysfunction of lumbar region: Secondary | ICD-10-CM | POA: Diagnosis not present

## 2018-07-21 DIAGNOSIS — M5137 Other intervertebral disc degeneration, lumbosacral region: Secondary | ICD-10-CM | POA: Diagnosis not present

## 2018-07-25 DIAGNOSIS — M5137 Other intervertebral disc degeneration, lumbosacral region: Secondary | ICD-10-CM | POA: Diagnosis not present

## 2018-07-25 DIAGNOSIS — M9905 Segmental and somatic dysfunction of pelvic region: Secondary | ICD-10-CM | POA: Diagnosis not present

## 2018-07-25 DIAGNOSIS — M5386 Other specified dorsopathies, lumbar region: Secondary | ICD-10-CM | POA: Diagnosis not present

## 2018-07-25 DIAGNOSIS — M9903 Segmental and somatic dysfunction of lumbar region: Secondary | ICD-10-CM | POA: Diagnosis not present

## 2018-08-01 DIAGNOSIS — M9903 Segmental and somatic dysfunction of lumbar region: Secondary | ICD-10-CM | POA: Diagnosis not present

## 2018-08-01 DIAGNOSIS — M5386 Other specified dorsopathies, lumbar region: Secondary | ICD-10-CM | POA: Diagnosis not present

## 2018-08-01 DIAGNOSIS — M9905 Segmental and somatic dysfunction of pelvic region: Secondary | ICD-10-CM | POA: Diagnosis not present

## 2018-08-01 DIAGNOSIS — M5137 Other intervertebral disc degeneration, lumbosacral region: Secondary | ICD-10-CM | POA: Diagnosis not present

## 2018-08-02 DIAGNOSIS — M25561 Pain in right knee: Secondary | ICD-10-CM | POA: Diagnosis not present

## 2018-08-05 DIAGNOSIS — G8929 Other chronic pain: Secondary | ICD-10-CM | POA: Insufficient documentation

## 2018-08-05 DIAGNOSIS — M25669 Stiffness of unspecified knee, not elsewhere classified: Secondary | ICD-10-CM | POA: Insufficient documentation

## 2018-08-05 DIAGNOSIS — M545 Low back pain, unspecified: Secondary | ICD-10-CM | POA: Insufficient documentation

## 2018-08-05 DIAGNOSIS — M23359 Other meniscus derangements, posterior horn of lateral meniscus, unspecified knee: Secondary | ICD-10-CM

## 2018-08-05 DIAGNOSIS — R609 Edema, unspecified: Secondary | ICD-10-CM | POA: Insufficient documentation

## 2018-08-05 DIAGNOSIS — M25569 Pain in unspecified knee: Secondary | ICD-10-CM | POA: Insufficient documentation

## 2018-08-05 DIAGNOSIS — M23329 Other meniscus derangements, posterior horn of medial meniscus, unspecified knee: Secondary | ICD-10-CM | POA: Insufficient documentation

## 2018-08-05 DIAGNOSIS — M2010 Hallux valgus (acquired), unspecified foot: Secondary | ICD-10-CM | POA: Insufficient documentation

## 2018-08-05 HISTORY — DX: Other meniscus derangements, posterior horn of lateral meniscus, unspecified knee: M23.359

## 2018-08-06 ENCOUNTER — Ambulatory Visit (INDEPENDENT_AMBULATORY_CARE_PROVIDER_SITE_OTHER): Payer: BLUE CROSS/BLUE SHIELD

## 2018-08-06 ENCOUNTER — Ambulatory Visit (INDEPENDENT_AMBULATORY_CARE_PROVIDER_SITE_OTHER): Payer: BLUE CROSS/BLUE SHIELD | Admitting: Podiatry

## 2018-08-06 DIAGNOSIS — M2012 Hallux valgus (acquired), left foot: Secondary | ICD-10-CM | POA: Diagnosis not present

## 2018-08-06 DIAGNOSIS — M25572 Pain in left ankle and joints of left foot: Secondary | ICD-10-CM

## 2018-08-06 DIAGNOSIS — M779 Enthesopathy, unspecified: Secondary | ICD-10-CM

## 2018-08-06 DIAGNOSIS — M21962 Unspecified acquired deformity of left lower leg: Secondary | ICD-10-CM | POA: Diagnosis not present

## 2018-08-06 DIAGNOSIS — M21612 Bunion of left foot: Secondary | ICD-10-CM

## 2018-08-06 DIAGNOSIS — R269 Unspecified abnormalities of gait and mobility: Secondary | ICD-10-CM

## 2018-08-06 DIAGNOSIS — M778 Other enthesopathies, not elsewhere classified: Secondary | ICD-10-CM

## 2018-08-06 NOTE — Patient Instructions (Signed)
Pre-Operative Instructions  Congratulations, you have decided to take an important step towards improving your quality of life.  You can be assured that the doctors and staff at [facility] will be with you every step of the way.  Here are some important things you should know:  1. Plan to be at the surgery center/hospital at least 1 (one) hour prior to your scheduled time, unless otherwise directed by the surgical center/hospital staff.  You must have a responsible adult accompany you, remain during the surgery and drive you home.  Make sure you have directions to the surgical center/hospital to ensure you arrive on time. 2. If you are having surgery at [facility] or [facility], you will need a copy of your medical history and physical form from your family physician within one month prior to the date of surgery. We will give you a form for your primary physician to complete.  3. We make every effort to accommodate the date you request for surgery.  However, there are times where surgery dates or times have to be moved.  We will contact you as soon as possible if a change in schedule is required.   4. No aspirin/ibuprofen for one week before surgery.  If you are on aspirin, any non-steroidal anti-inflammatory medications (Mobic, Aleve, Ibuprofen) should not be taken seven (7) days prior to your surgery.  You make take Tylenol for pain prior to surgery.  5. Medications - If you are taking daily heart and blood pressure medications, seizure, reflux, allergy, asthma, anxiety, pain or diabetes medications, make sure you notify the surgery center/hospital before the day of surgery so they can tell you which medications you should take or avoid the day of surgery. 6. No food or drink after midnight the night before surgery unless directed otherwise by surgical center/hospital staff. 7. No alcoholic beverages 24-hours prior to surgery.  No smoking 24-hours prior or 24-hours after  surgery. 8. Wear loose pants or shorts. They should be loose enough to fit over bandages, boots, and casts. 9. Don't wear slip-on shoes. Sneakers are preferred. 10. Bring your boot with you to the surgery center/hospital.  Also bring crutches or a walker if your physician has prescribed it for you.  If you do not have this equipment, it will be provided for you after surgery. 11. If you have not been contacted by the surgery center/hospital by the day before your surgery, call to confirm the date and time of your surgery. 12. Leave-time from work may vary depending on the type of surgery you have.  Appropriate arrangements should be made prior to surgery with your employer. 13. Prescriptions will be provided immediately following surgery by your doctor.  Fill these as soon as possible after surgery and take the medication as directed. Pain medications will not be refilled on weekends and must be approved by the doctor. 14. Remove nail polish on the operative foot and avoid getting pedicures prior to surgery. 15. Wash the night before surgery.  The night before surgery wash the foot and leg well with water and the antibacterial soap provided. Be sure to pay special attention to beneath the toenails and in between the toes.  Wash for at least three (3) minutes. Rinse thoroughly with water and dry well with a towel.  Perform this wash unless told not to do so by your physician.  Enclosed: 1 Ice pack (please put in freezer the night before surgery)   1 Hibiclens skin cleaner     Pre-op instructions  If you have any questions regarding the instructions, please do not hesitate to call our office.  [place]: [address], [place], [place] [address] -- [phone]  [place]: [address] , [place] [address] -- [phone]  [place]: [address]  [place], [place] [address] -- [phone]  [place]: [address], [address], [place], [place] [address] -- [phone]  Website:  [email or web address] 

## 2018-08-06 NOTE — Progress Notes (Signed)
  Subjective:  Patient ID: Gabriel Carroll, male    DOB: 1965-10-28,  MRN: [id number]  Chief Complaint  Patient presents with  . Bunions    left foot bunion pain    52 y.o. male presents with the above complaint. Reports painful bunion deformity to the left foot for many years. Has underwent surgical correction to the right foot bunion and wishes to proceed with surgery on the left. Has tried OTC pain meds, shoe gear change, without relief.  Works as an Chief Financial Officer. Mostly sedentary at work.  Review of Systems: Negative except as noted in the HPI. Denies N/V/F/Ch.  Past Medical History:  Diagnosis Date  . Arthritis    OA left shoulder  . Atrial fibrillation ([place])   . Low testosterone   . Obesity (BMI 30-39.9)   . Sleep apnea    no CPAP    Current Outpatient Medications:  .  acetaminophen (TYLENOL) 500 MG tablet, Take 500 mg by mouth every 6 (six) hours as needed for mild pain, moderate pain or headache. , Disp: , Rfl:  .  clomiPHENE (CLOMID) 50 MG tablet, Take 25 mg by mouth daily. , Disp: , Rfl:  .  flecainide (TAMBOCOR) 150 MG tablet, Take 1 tablet (150 mg total) by mouth as needed. (Patient not taking: Reported on 04/26/2018), Disp: 10 tablet, Rfl: 1 .  ibuprofen (ADVIL,MOTRIN) 200 MG tablet, Take 400 mg by mouth daily as needed., Disp: , Rfl:  .  metoprolol succinate (TOPROL-XL) 25 MG 24 hr tablet, Take 1 tablet (25 mg total) by mouth daily. (Patient not taking: Reported on 04/26/2018), Disp: 30 tablet, Rfl: 12  Social History   Tobacco Use  Smoking Status Never Smoker  Smokeless Tobacco Never Used    Allergies  Allergen Reactions  . Vancomycin Other (See Comments)    Red man syndrome   Objective:  There were no vitals filed for this visit. There is no height or weight on file to calculate BMI. Constitutional Well developed. Well nourished.  Vascular Dorsalis pedis pulses palpable bilaterally. Posterior tibial pulses palpable bilaterally. Capillary refill normal to all  digits.  No cyanosis or clubbing noted. Pedal hair growth normal.  Neurologic Normal speech. Oriented to person, place, and time. Epicritic sensation to light touch grossly present bilaterally.  Dermatologic Nails well groomed and normal in appearance. No open wounds. No skin lesions.  Orthopedic: Normal joint ROM without pain or crepitus bilaterally. Hallux abductovalgus deformity present - left Left 1st MPJ full range of motion. Left 1st TMT without gross hypermobility. Right 1st MPJ full range of motion  Right 1st TMT without gross hypermobility. Lesser digital contractures absent bilaterally.   Radiographs: Taken and reviewed. Hallux abductovalgus deformity present. Metatarsal parabola normal. 1st/2nd IMA: ~13; TSP: 3  Assessment:   1. Hallux abductovalgus with bunions, left   2. Metatarsal deformity, left   3. Pain in joint, foot, left   4. Acquired hallux valgus of left foot   5. Gait disturbance    Plan:  Patient was evaluated and treated and all questions answered.  Hallux abductovalgus deformity,  -XR as above. -Patient has failed all conservative therapy and wishes to proceed with surgical intervention. All risks, benefits, and alternatives discussed with patient. No guarantees given. Consent reviewed and signed by patient. Post-op course explained at length. -Boot dispensed for post-operative use. -Planned procedures: L Austin Bunionectomy -Risk factors: none  Return for post op.

## 2018-08-07 DIAGNOSIS — M25561 Pain in right knee: Secondary | ICD-10-CM | POA: Diagnosis not present

## 2018-08-08 DIAGNOSIS — R972 Elevated prostate specific antigen [PSA]: Secondary | ICD-10-CM | POA: Diagnosis not present

## 2018-08-08 DIAGNOSIS — E291 Testicular hypofunction: Secondary | ICD-10-CM | POA: Diagnosis not present

## 2018-08-09 DIAGNOSIS — M25561 Pain in right knee: Secondary | ICD-10-CM | POA: Diagnosis not present

## 2018-08-17 DIAGNOSIS — N400 Enlarged prostate without lower urinary tract symptoms: Secondary | ICD-10-CM | POA: Diagnosis not present

## 2018-08-17 DIAGNOSIS — E291 Testicular hypofunction: Secondary | ICD-10-CM | POA: Diagnosis not present

## 2018-08-17 DIAGNOSIS — R972 Elevated prostate specific antigen [PSA]: Secondary | ICD-10-CM | POA: Diagnosis not present

## 2018-08-18 DIAGNOSIS — M5137 Other intervertebral disc degeneration, lumbosacral region: Secondary | ICD-10-CM | POA: Diagnosis not present

## 2018-08-18 DIAGNOSIS — M9903 Segmental and somatic dysfunction of lumbar region: Secondary | ICD-10-CM | POA: Diagnosis not present

## 2018-08-18 DIAGNOSIS — M5386 Other specified dorsopathies, lumbar region: Secondary | ICD-10-CM | POA: Diagnosis not present

## 2018-08-18 DIAGNOSIS — M9905 Segmental and somatic dysfunction of pelvic region: Secondary | ICD-10-CM | POA: Diagnosis not present

## 2018-08-30 ENCOUNTER — Telehealth: Payer: Self-pay | Admitting: *Deleted

## 2018-08-30 NOTE — Telephone Encounter (Signed)
"  I'm scheduled to have surgery with Dr. March Rummage next week.  I was told I would hear from someone about the time.  I still haven't heard anything."  Someone from the surgical center usually will call you a day or two prior to your surgical date.  They will give you your arrival time.  However, you are welcome to call them to find out the time if you would like.  Their phone number is on the back of the brochure that we gave you.  "Okay, thank you."

## 2018-09-01 DIAGNOSIS — M5386 Other specified dorsopathies, lumbar region: Secondary | ICD-10-CM | POA: Diagnosis not present

## 2018-09-01 DIAGNOSIS — M9903 Segmental and somatic dysfunction of lumbar region: Secondary | ICD-10-CM | POA: Diagnosis not present

## 2018-09-01 DIAGNOSIS — M5137 Other intervertebral disc degeneration, lumbosacral region: Secondary | ICD-10-CM | POA: Diagnosis not present

## 2018-09-01 DIAGNOSIS — M9905 Segmental and somatic dysfunction of pelvic region: Secondary | ICD-10-CM | POA: Diagnosis not present

## 2018-09-07 ENCOUNTER — Other Ambulatory Visit: Payer: Self-pay | Admitting: Podiatry

## 2018-09-07 DIAGNOSIS — M25572 Pain in left ankle and joints of left foot: Secondary | ICD-10-CM | POA: Diagnosis not present

## 2018-09-07 DIAGNOSIS — M2012 Hallux valgus (acquired), left foot: Secondary | ICD-10-CM | POA: Diagnosis not present

## 2018-09-07 DIAGNOSIS — I499 Cardiac arrhythmia, unspecified: Secondary | ICD-10-CM | POA: Diagnosis not present

## 2018-09-07 MED ORDER — OXYCODONE-ACETAMINOPHEN 10-325 MG PO TABS: 1.0000 | ORAL_TABLET | ORAL | 0 refills | Status: DC | PRN

## 2018-09-07 MED ORDER — CEPHALEXIN 500 MG PO CAPS: 500.0000 mg | ORAL_CAPSULE | Freq: Two times a day (BID) | ORAL | 0 refills | Status: DC

## 2018-09-07 NOTE — Progress Notes (Signed)
Rx sent to pharmacy for outpatient surgery. °

## 2018-09-12 ENCOUNTER — Telehealth: Payer: Self-pay | Admitting: Podiatry

## 2018-09-12 MED ORDER — OXYCODONE-ACETAMINOPHEN 10-325 MG PO TABS: 1.0000 | ORAL_TABLET | ORAL | 0 refills | Status: DC | PRN

## 2018-09-12 NOTE — Telephone Encounter (Signed)
Left message that the medication had been sent to the [facility].

## 2018-09-12 NOTE — Addendum Note (Signed)
Addended by: Hardie Pulley on: 09/12/2018 03:54 PM   Modules accepted: Orders

## 2018-09-12 NOTE — Telephone Encounter (Signed)
I called pt and he states he is having pain and the big toe is swollen and he does not remember this much pain with his surgery 4-5 years ago. I told pt that although the surgery may be the same in name, each foot and procedure are a little different. I instructed pt to remove the surgical boot, open-ended sock and ace wrap, leave the gauze intact, then elevate the foot for 15 minutes, if pain worsens then dangle for 15 minutes, after 15 minutes put foot level with the hip and rewrap the ace beginning at the toes roll looser single layer up the leg, then replace sock and boot. I told pt to take Ibuprofen if tolerates as the package instructs and he states he is alternating with tylenol. I offered pt a refill of the percocet and he states that would help him rest. I told pt to stop the additional tylenol and use the ibuprofen between the dosing of the percocet pt states understanding.

## 2018-09-12 NOTE — Telephone Encounter (Signed)
Pt had surgery last Wednesday, still in pain. Wondering if he is able to take off ace bandage to release some 'tightness'. Please give pt a call.

## 2018-09-15 ENCOUNTER — Ambulatory Visit (INDEPENDENT_AMBULATORY_CARE_PROVIDER_SITE_OTHER): Payer: Self-pay | Admitting: Podiatry

## 2018-09-15 ENCOUNTER — Ambulatory Visit (INDEPENDENT_AMBULATORY_CARE_PROVIDER_SITE_OTHER): Payer: BLUE CROSS/BLUE SHIELD

## 2018-09-15 ENCOUNTER — Other Ambulatory Visit: Payer: BLUE CROSS/BLUE SHIELD

## 2018-09-15 VITALS — Temp 96.2°F

## 2018-09-15 DIAGNOSIS — M2012 Hallux valgus (acquired), left foot: Secondary | ICD-10-CM

## 2018-09-15 DIAGNOSIS — M21612 Bunion of left foot: Secondary | ICD-10-CM | POA: Diagnosis not present

## 2018-09-15 DIAGNOSIS — M5386 Other specified dorsopathies, lumbar region: Secondary | ICD-10-CM | POA: Diagnosis not present

## 2018-09-15 DIAGNOSIS — M9903 Segmental and somatic dysfunction of lumbar region: Secondary | ICD-10-CM | POA: Diagnosis not present

## 2018-09-15 DIAGNOSIS — M5137 Other intervertebral disc degeneration, lumbosacral region: Secondary | ICD-10-CM | POA: Diagnosis not present

## 2018-09-15 DIAGNOSIS — M9905 Segmental and somatic dysfunction of pelvic region: Secondary | ICD-10-CM | POA: Diagnosis not present

## 2018-09-15 MED ORDER — OXYCODONE-ACETAMINOPHEN 10-325 MG PO TABS: 1.0000 | ORAL_TABLET | ORAL | 0 refills | Status: DC | PRN

## 2018-09-15 NOTE — Progress Notes (Signed)
  Subjective:  Patient ID: Gabriel Carroll, male    DOB: Mar 09, 1966,  MRN: [id number]  Chief Complaint  Patient presents with  . Routine Post [facility] 11.27.2019 Austin Bunionectomy [place] " my pain has been manageable"     DOS: 09/07/18 Procedure: Austin bunionectomy left foot  52 y.o. male returns for post-op check.  Had some issues with pain first few days after surgery but otherwise manageable now.  Denies postop issues  Review of Systems: Negative except as noted in the HPI. Denies N/V/F/Ch.  Past Medical History:  Diagnosis Date  . Arthritis    OA left shoulder  . Atrial fibrillation ([place])   . Low testosterone   . Obesity (BMI 30-39.9)   . Sleep apnea    no CPAP    Current Outpatient Medications:  .  acetaminophen (TYLENOL) 500 MG tablet, Take 500 mg by mouth every 6 (six) hours as needed for mild pain, moderate pain or headache. , Disp: , Rfl:  .  cephALEXin (KEFLEX) 500 MG capsule, Take 1 capsule (500 mg total) by mouth 2 (two) times daily., Disp: 14 capsule, Rfl: 0 .  clomiPHENE (CLOMID) 50 MG tablet, Take 25 mg by mouth daily. , Disp: , Rfl:  .  flecainide (TAMBOCOR) 150 MG tablet, Take 1 tablet (150 mg total) by mouth as needed. (Patient not taking: Reported on 04/26/2018), Disp: 10 tablet, Rfl: 1 .  ibuprofen (ADVIL,MOTRIN) 200 MG tablet, Take 400 mg by mouth daily as needed., Disp: , Rfl:  .  metoprolol succinate (TOPROL-XL) 25 MG 24 hr tablet, Take 1 tablet (25 mg total) by mouth daily. (Patient not taking: Reported on 04/26/2018), Disp: 30 tablet, Rfl: 12 .  oxyCODONE-acetaminophen (PERCOCET) 10-325 MG tablet, Take 1 tablet by mouth every 4 (four) hours as needed for pain., Disp: 20 tablet, Rfl: 0  Social History   Tobacco Use  Smoking Status Never Smoker  Smokeless Tobacco Never Used    Allergies  Allergen Reactions  . Vancomycin Other (See Comments)    Red man syndrome   Objective:   Vitals:   09/15/18 1410  Temp: (!) 96.2 F (35.7 C)   There is no height  or weight on file to calculate BMI. Constitutional Well developed. Well nourished.  Vascular Foot warm and well perfused. Capillary refill normal to all digits.   Neurologic Normal speech. Oriented to person, place, and time. Epicritic sensation to light touch grossly present bilaterally.  Dermatologic Skin healing well without signs of infection. Skin edges well coapted without signs of infection.  Orthopedic: Tenderness to palpation noted about the surgical site.   Radiographs: Taken reviewed consistent postop state good alignment noted Assessment:   1. Hallux abductovalgus with bunions, left    Plan:  Patient was evaluated and treated and all questions answered.  S/p foot surgery left -Progressing as expected post-operatively. -XR: As above -WB Status: Weight-bear as tolerated in cam boot -Sutures: Intact. -Medications: Percocet refilled -Foot redressed.  Return for price patient, keep current post op appt.

## 2018-09-22 ENCOUNTER — Ambulatory Visit (INDEPENDENT_AMBULATORY_CARE_PROVIDER_SITE_OTHER): Payer: BLUE CROSS/BLUE SHIELD | Admitting: Podiatry

## 2018-09-22 DIAGNOSIS — M2012 Hallux valgus (acquired), left foot: Secondary | ICD-10-CM

## 2018-09-22 DIAGNOSIS — M21612 Bunion of left foot: Secondary | ICD-10-CM | POA: Diagnosis not present

## 2018-09-23 NOTE — Progress Notes (Signed)
Subjective:   Patient ID: Gabriel Carroll, male   DOB: 52 y.o.   MRN: [id number]   HPI Patient presents with bunion site that was fixed approximately 2 weeks ago by Dr. March Rummage.  Patient states he is doing well with mild discomfort but overall pleased    ROS      Objective:  Physical Exam  Neurovascular status intact negative Homans sign noted with wound edges well coapted left first MPJ in good alignment with range of motion adequate with no crepitus     Assessment:  Doing well post Austin osteotomy left     Plan:  Reviewed x-ray reapplied sterile dressing continue immobilization elevation compression and reappoint for recheck in approximately 2 weeks  X-ray indicates screws in place good alignment noted joint congruence

## 2018-09-29 DIAGNOSIS — M9903 Segmental and somatic dysfunction of lumbar region: Secondary | ICD-10-CM | POA: Diagnosis not present

## 2018-09-29 DIAGNOSIS — M5386 Other specified dorsopathies, lumbar region: Secondary | ICD-10-CM | POA: Diagnosis not present

## 2018-09-29 DIAGNOSIS — M5137 Other intervertebral disc degeneration, lumbosacral region: Secondary | ICD-10-CM | POA: Diagnosis not present

## 2018-09-29 DIAGNOSIS — M9905 Segmental and somatic dysfunction of pelvic region: Secondary | ICD-10-CM | POA: Diagnosis not present

## 2018-10-14 ENCOUNTER — Ambulatory Visit (INDEPENDENT_AMBULATORY_CARE_PROVIDER_SITE_OTHER): Payer: BLUE CROSS/BLUE SHIELD

## 2018-10-14 ENCOUNTER — Ambulatory Visit (INDEPENDENT_AMBULATORY_CARE_PROVIDER_SITE_OTHER): Payer: BLUE CROSS/BLUE SHIELD | Admitting: Podiatry

## 2018-10-14 DIAGNOSIS — M21612 Bunion of left foot: Secondary | ICD-10-CM

## 2018-10-14 DIAGNOSIS — M2012 Hallux valgus (acquired), left foot: Secondary | ICD-10-CM

## 2018-10-23 NOTE — Progress Notes (Signed)
  Subjective:  Patient ID: Gabriel Carroll, male    DOB: [birth year],  MRN: [id number]  Chief Complaint  Patient presents with  . Routine Post [facility] 11.27.2019 Austin Bunionectomy [place]     DOS: 09/07/18 Procedure: Austin bunionectomy left foot  53 y.o. male returns for post-op check.  Doing well not having much pain  Review of Systems: Negative except as noted in the HPI. Denies N/V/F/Ch.  Past Medical History:  Diagnosis Date  . Arthritis    OA left shoulder  . Atrial fibrillation ([place])   . Low testosterone   . Obesity (BMI 30-39.9)   . Sleep apnea    no CPAP    Current Outpatient Medications:  .  acetaminophen (TYLENOL) 500 MG tablet, Take 500 mg by mouth every 6 (six) hours as needed for mild pain, moderate pain or headache. , Disp: , Rfl:  .  cephALEXin (KEFLEX) 500 MG capsule, Take 1 capsule (500 mg total) by mouth 2 (two) times daily., Disp: 14 capsule, Rfl: 0 .  clomiPHENE (CLOMID) 50 MG tablet, Take 25 mg by mouth daily. , Disp: , Rfl:  .  flecainide (TAMBOCOR) 150 MG tablet, Take 1 tablet (150 mg total) by mouth as needed. (Patient not taking: Reported on 04/26/2018), Disp: 10 tablet, Rfl: 1 .  ibuprofen (ADVIL,MOTRIN) 200 MG tablet, Take 400 mg by mouth daily as needed., Disp: , Rfl:  .  metoprolol succinate (TOPROL-XL) 25 MG 24 hr tablet, Take 1 tablet (25 mg total) by mouth daily. (Patient not taking: Reported on 04/26/2018), Disp: 30 tablet, Rfl: 12 .  oxyCODONE-acetaminophen (PERCOCET) 10-325 MG tablet, Take 1 tablet by mouth every 4 (four) hours as needed for pain., Disp: 20 tablet, Rfl: 0  Social History   Tobacco Use  Smoking Status Never Smoker  Smokeless Tobacco Never Used    Allergies  Allergen Reactions  . Vancomycin Other (See Comments)    Red man syndrome   Objective:   There were no vitals filed for this visit. There is no height or weight on file to calculate BMI. Constitutional Well developed. Well nourished.  Vascular Foot warm and well  perfused. Capillary refill normal to all digits.   Neurologic Normal speech. Oriented to person, place, and time. Epicritic sensation to light touch grossly present bilaterally.  Dermatologic Skin healed.  Orthopedic: No tenderness to palpation noted about the surgical site.   Radiographs: Taken reviewed osteotomy healed. Assessment:   1. Hallux abductovalgus with bunions, left    Plan:  Patient was evaluated and treated and all questions answered.  S/p foot surgery left -Progressing as expected post-operatively. -XR: As above -WB Status: Weight-bear as tolerated in normal shoegear. Transition to normal shoegear. -Sutures: out. -Medications: none refilled -Foot redressed. -Transition slowly to normal activity  Return in about 1 month (around 11/14/2018) for Post-op.

## 2018-11-18 ENCOUNTER — Encounter: Payer: BLUE CROSS/BLUE SHIELD | Admitting: Podiatry

## 2018-11-23 NOTE — Progress Notes (Signed)
Erroneous encounter - please disregard.

## 2018-11-25 ENCOUNTER — Ambulatory Visit (INDEPENDENT_AMBULATORY_CARE_PROVIDER_SITE_OTHER): Payer: BLUE CROSS/BLUE SHIELD

## 2018-11-25 ENCOUNTER — Encounter: Payer: Self-pay | Admitting: Podiatry

## 2018-11-25 ENCOUNTER — Ambulatory Visit (INDEPENDENT_AMBULATORY_CARE_PROVIDER_SITE_OTHER): Payer: BLUE CROSS/BLUE SHIELD | Admitting: Podiatry

## 2018-11-25 DIAGNOSIS — M2012 Hallux valgus (acquired), left foot: Secondary | ICD-10-CM | POA: Diagnosis not present

## 2018-11-25 DIAGNOSIS — M21612 Bunion of left foot: Secondary | ICD-10-CM | POA: Diagnosis not present

## 2018-11-25 DIAGNOSIS — L570 Actinic keratosis: Secondary | ICD-10-CM | POA: Diagnosis not present

## 2018-11-25 DIAGNOSIS — Z9889 Other specified postprocedural states: Secondary | ICD-10-CM

## 2018-11-25 DIAGNOSIS — D2261 Melanocytic nevi of right upper limb, including shoulder: Secondary | ICD-10-CM | POA: Diagnosis not present

## 2018-11-25 DIAGNOSIS — L859 Epidermal thickening, unspecified: Secondary | ICD-10-CM | POA: Diagnosis not present

## 2018-11-25 DIAGNOSIS — D225 Melanocytic nevi of trunk: Secondary | ICD-10-CM | POA: Diagnosis not present

## 2018-11-25 DIAGNOSIS — D2262 Melanocytic nevi of left upper limb, including shoulder: Secondary | ICD-10-CM | POA: Diagnosis not present

## 2018-11-25 DIAGNOSIS — D485 Neoplasm of uncertain behavior of skin: Secondary | ICD-10-CM | POA: Diagnosis not present

## 2018-12-10 NOTE — Progress Notes (Signed)
  Subjective:  Patient ID: Gabriel Carroll, male    DOB: 1966/07/17,  MRN: [id number]  Chief Complaint  Patient presents with  . Routine Post Op    DOS 09-07-18  Liane Comber bunionectomy left   "Its a little sore but overall okay"    DOS: 09/07/18 Procedure: Austin bunionectomy left foot  54 y.o. male returns for post-op check. Doing well amubulating in normal shoes without pain. Occasional soreness.  Review of Systems: Negative except as noted in the HPI. Denies N/V/F/Ch.  Past Medical History:  Diagnosis Date  . Arthritis    OA left shoulder  . Atrial fibrillation ([place])   . Low testosterone   . Obesity (BMI 30-39.9)   . Sleep apnea    no CPAP    Current Outpatient Medications:  .  acetaminophen (TYLENOL) 500 MG tablet, Take 500 mg by mouth every 6 (six) hours as needed for mild pain, moderate pain or headache. , Disp: , Rfl:  .  clomiPHENE (CLOMID) 50 MG tablet, Take 25 mg by mouth daily. , Disp: , Rfl:  .  ibuprofen (ADVIL,MOTRIN) 200 MG tablet, Take 400 mg by mouth daily as needed., Disp: , Rfl:  .  metoprolol succinate (TOPROL-XL) 25 MG 24 hr tablet, Take 1 tablet (25 mg total) by mouth daily. (Patient not taking: Reported on 04/26/2018), Disp: 30 tablet, Rfl: 12  Social History   Tobacco Use  Smoking Status Never Smoker  Smokeless Tobacco Never Used    Allergies  Allergen Reactions  . Vancomycin Other (See Comments)    Red man syndrome   Objective:   There were no vitals filed for this visit. There is no height or weight on file to calculate BMI. Constitutional Well developed. Well nourished.  Vascular Foot warm and well perfused. Capillary refill normal to all digits.   Neurologic Normal speech. Oriented to person, place, and time. Epicritic sensation to light touch grossly present bilaterally.  Dermatologic Skin healed.  Orthopedic: No tenderness to palpation noted about the surgical site. Good hallux position.   Radiographs: None Assessment:   1.  Post-operative state    Plan:  Patient was evaluated and treated and all questions answered.  S/p foot surgery left -Progressing as expected post-operatively. -WB Status: Weight-bear as tolerated in normal shoegear.  -F/u PRN  Return if symptoms worsen or fail to improve.

## 2019-02-08 DIAGNOSIS — E291 Testicular hypofunction: Secondary | ICD-10-CM | POA: Diagnosis not present

## 2019-02-20 DIAGNOSIS — E291 Testicular hypofunction: Secondary | ICD-10-CM | POA: Diagnosis not present

## 2019-02-20 DIAGNOSIS — R972 Elevated prostate specific antigen [PSA]: Secondary | ICD-10-CM | POA: Diagnosis not present

## 2019-02-20 DIAGNOSIS — N400 Enlarged prostate without lower urinary tract symptoms: Secondary | ICD-10-CM | POA: Diagnosis not present

## 2019-08-04 DIAGNOSIS — M2012 Hallux valgus (acquired), left foot: Secondary | ICD-10-CM | POA: Diagnosis not present

## 2019-08-04 DIAGNOSIS — M2022 Hallux rigidus, left foot: Secondary | ICD-10-CM | POA: Diagnosis not present

## 2019-09-18 DIAGNOSIS — M9905 Segmental and somatic dysfunction of pelvic region: Secondary | ICD-10-CM | POA: Diagnosis not present

## 2019-09-18 DIAGNOSIS — M9903 Segmental and somatic dysfunction of lumbar region: Secondary | ICD-10-CM | POA: Diagnosis not present

## 2019-09-18 DIAGNOSIS — M5386 Other specified dorsopathies, lumbar region: Secondary | ICD-10-CM | POA: Diagnosis not present

## 2019-09-18 DIAGNOSIS — M5137 Other intervertebral disc degeneration, lumbosacral region: Secondary | ICD-10-CM | POA: Diagnosis not present

## 2019-09-22 DIAGNOSIS — M9903 Segmental and somatic dysfunction of lumbar region: Secondary | ICD-10-CM | POA: Diagnosis not present

## 2019-09-22 DIAGNOSIS — M5386 Other specified dorsopathies, lumbar region: Secondary | ICD-10-CM | POA: Diagnosis not present

## 2019-09-22 DIAGNOSIS — M5137 Other intervertebral disc degeneration, lumbosacral region: Secondary | ICD-10-CM | POA: Diagnosis not present

## 2019-09-22 DIAGNOSIS — M9905 Segmental and somatic dysfunction of pelvic region: Secondary | ICD-10-CM | POA: Diagnosis not present

## 2019-09-29 DIAGNOSIS — M5137 Other intervertebral disc degeneration, lumbosacral region: Secondary | ICD-10-CM | POA: Diagnosis not present

## 2019-09-29 DIAGNOSIS — M9905 Segmental and somatic dysfunction of pelvic region: Secondary | ICD-10-CM | POA: Diagnosis not present

## 2019-09-29 DIAGNOSIS — M9903 Segmental and somatic dysfunction of lumbar region: Secondary | ICD-10-CM | POA: Diagnosis not present

## 2019-09-29 DIAGNOSIS — M5386 Other specified dorsopathies, lumbar region: Secondary | ICD-10-CM | POA: Diagnosis not present

## 2019-10-03 ENCOUNTER — Ambulatory Visit: Payer: BLUE CROSS/BLUE SHIELD | Attending: Internal Medicine

## 2019-10-03 DIAGNOSIS — Z20822 Contact with and (suspected) exposure to covid-19: Secondary | ICD-10-CM

## 2019-10-04 LAB — NOVEL CORONAVIRUS, NAA: SARS-CoV-2, NAA: NOT DETECTED

## 2020-01-27 ENCOUNTER — Ambulatory Visit: Payer: BLUE CROSS/BLUE SHIELD | Attending: Internal Medicine

## 2020-01-27 DIAGNOSIS — Z23 Encounter for immunization: Secondary | ICD-10-CM

## 2020-01-27 NOTE — Progress Notes (Signed)
   Covid-19 Vaccination Clinic  Name:  Gabriel Carroll    MRN: [id number] DOB: 09-29-1966  01/27/2020  Mr. Gabriel Carroll was observed post Covid-19 immunization for 15 minutes without incident. He was provided with Vaccine Information Sheet and instruction to access the V-Safe system.   Mr. Gabriel Carroll was instructed to call 911 with any severe reactions post vaccine: [place] Kitchen Difficulty breathing  . Swelling of face and throat  . A fast heartbeat  . A bad rash all over body  . Dizziness and weakness   Immunizations Administered    Name Date Dose VIS Date Route   Pfizer COVID-19 Vaccine 01/27/2020 10:08 AM 0.3 mL 09/22/2019 Intramuscular   Manufacturer: [place]   Lot: [id number]   [place]: [id number]

## 2020-02-19 ENCOUNTER — Ambulatory Visit: Payer: BLUE CROSS/BLUE SHIELD | Attending: Internal Medicine

## 2020-02-19 DIAGNOSIS — Z23 Encounter for immunization: Secondary | ICD-10-CM

## 2020-02-19 NOTE — Progress Notes (Signed)
   Covid-19 Vaccination Clinic  Name:  Gabriel Carroll    MRN: [id number] DOB: 08-15-1966  02/19/2020  [name] was observed post Covid-19 immunization for 15 minutes without incident. He was provided with Vaccine Information Sheet and instruction to access the V-Safe system.   [name] was instructed to call 911 with any severe reactions post vaccine: [place] Kitchen Difficulty breathing  . Swelling of face and throat  . A fast heartbeat  . A bad rash all over body  . Dizziness and weakness   Immunizations Administered    Name Date Dose VIS Date Route   Pfizer COVID-19 Vaccine 02/19/2020  9:49 AM 0.3 mL 12/06/2018 Intramuscular   Manufacturer: [place]   Lot: [id number]   [place]: [id number]

## 2020-04-26 ENCOUNTER — Ambulatory Visit (INDEPENDENT_AMBULATORY_CARE_PROVIDER_SITE_OTHER): Payer: BC Managed Care – PPO

## 2020-04-26 ENCOUNTER — Other Ambulatory Visit: Payer: Self-pay

## 2020-04-26 ENCOUNTER — Ambulatory Visit (INDEPENDENT_AMBULATORY_CARE_PROVIDER_SITE_OTHER): Payer: BC Managed Care – PPO | Admitting: Podiatry

## 2020-04-26 ENCOUNTER — Other Ambulatory Visit: Payer: Self-pay | Admitting: Podiatry

## 2020-04-26 DIAGNOSIS — M7752 Other enthesopathy of left foot: Secondary | ICD-10-CM

## 2020-04-26 DIAGNOSIS — M2012 Hallux valgus (acquired), left foot: Secondary | ICD-10-CM

## 2020-04-26 DIAGNOSIS — R52 Pain, unspecified: Secondary | ICD-10-CM

## 2020-04-26 DIAGNOSIS — M21612 Bunion of left foot: Secondary | ICD-10-CM

## 2020-04-26 NOTE — Progress Notes (Signed)
  Subjective:  Patient ID: Gabriel Carroll, male    DOB: [birth year],  MRN: [id number]  Chief Complaint  Patient presents with  . Foot Pain    Pain in left foot, had bunion surgery 11.27.19. Has been an issue for the last 2 years. Aching type of pain in the plantar area.    54 y.o. male presents with the above complaint. History confirmed with patient.   Objective:  Physical Exam: warm, good capillary refill, no trophic changes or ulcerative lesions, normal DP and PT pulses and normal sensory exam. Left Foot: No pain 1st MPJ. Full 1st MPJ ROM. POP 2nd MPJ.  No images are attached to the encounter.  Radiographs: X-ray of the left foot: no fracture, dislocation, swelling or degenerative changes noted and elongated second metatarsal. Well healed 1st met osteotomy, good alignment. Mild ST edema. Assessment:   1. Capsulitis of metatarsophalangeal (MTP) joint of left foot   2. Hallux abductovalgus with bunions, left    Plan:  Patient was evaluated and treated and all questions answered.  Hallux Valgus -Well healed, no pain  Capsulitis -Educated on etiology -XR reviewed with patient -Discussed padding and proper shoegear -Offloading pad applied -Injection delivered to the painful joint -May benefit from CMOs  Procedure: Joint Injection Location: Left 2nd MPJ joint Skin Prep: Alcohol. Injectate: 0.5 cc 1% lidocaine plain, 0.5 cc dexamethasone phosphate. Disposition: Patient tolerated procedure well. Injection site dressed with a band-aid.  Return in about 1 month (around 05/27/2020).

## 2020-04-29 ENCOUNTER — Other Ambulatory Visit: Payer: Self-pay | Admitting: *Deleted

## 2020-04-29 ENCOUNTER — Ambulatory Visit: Payer: BLUE CROSS/BLUE SHIELD | Attending: Internal Medicine

## 2020-04-29 DIAGNOSIS — Z20822 Contact with and (suspected) exposure to covid-19: Secondary | ICD-10-CM

## 2020-04-30 LAB — NOVEL CORONAVIRUS, NAA: SARS-CoV-2, NAA: DETECTED — AB

## 2020-04-30 LAB — SARS-COV-2, NAA 2 DAY TAT

## 2020-05-01 ENCOUNTER — Telehealth: Payer: Self-pay | Admitting: Nurse Practitioner

## 2020-05-01 NOTE — Telephone Encounter (Signed)
Called to Discuss with patient about Covid symptoms and the use of bamlanivimab, a monoclonal antibody infusion for those with mild to moderate Covid symptoms and at a high risk of hospitalization.     Pt is qualified for this infusion at the [facility] infusion center due to co-morbid conditions and/or a member of an at-risk group.     Unable to reach pt - left message for patient to return call  

## 2020-05-02 ENCOUNTER — Encounter: Payer: Self-pay | Admitting: Primary Care

## 2020-05-02 ENCOUNTER — Other Ambulatory Visit: Payer: Self-pay

## 2020-05-02 ENCOUNTER — Telehealth (INDEPENDENT_AMBULATORY_CARE_PROVIDER_SITE_OTHER): Payer: BC Managed Care – PPO | Admitting: Primary Care

## 2020-05-02 ENCOUNTER — Other Ambulatory Visit: Payer: Self-pay | Admitting: Nurse Practitioner

## 2020-05-02 ENCOUNTER — Ambulatory Visit (HOSPITAL_COMMUNITY)
Admission: RE | Admit: 2020-05-02 | Discharge: 2020-05-02 | Disposition: A | Discharge status: Home / Self Care | Payer: BC Managed Care – PPO | Source: Ambulatory Visit | Attending: Pulmonary Disease | Admitting: Pulmonary Disease

## 2020-05-02 DIAGNOSIS — E669 Obesity, unspecified: Secondary | ICD-10-CM | POA: Diagnosis not present

## 2020-05-02 DIAGNOSIS — U071 COVID-19: Secondary | ICD-10-CM | POA: Insufficient documentation

## 2020-05-02 HISTORY — DX: COVID-19: U07.1

## 2020-05-02 MED ORDER — EPINEPHRINE 0.3 MG/0.3ML IJ SOAJ: 0.3000 mg | Freq: Once | INTRAMUSCULAR | Status: DC | PRN

## 2020-05-02 MED ORDER — FAMOTIDINE IN NACL 20-0.9 MG/50ML-% IV SOLN: 20.0000 mg | Freq: Once | INTRAVENOUS | Status: DC | PRN

## 2020-05-02 MED ORDER — SODIUM CHLORIDE 0.9 % IV SOLN: INTRAVENOUS | Status: DC | PRN

## 2020-05-02 MED ORDER — SODIUM CHLORIDE 0.9 % IV SOLN
Freq: Once | INTRAVENOUS | Status: AC
  Filled 2020-05-02: qty 5

## 2020-05-02 MED ORDER — DIPHENHYDRAMINE HCL 50 MG/ML IJ SOLN: 50.0000 mg | Freq: Once | INTRAMUSCULAR | Status: DC | PRN

## 2020-05-02 MED ORDER — METHYLPREDNISOLONE SODIUM SUCC 125 MG IJ SOLR: 125.0000 mg | Freq: Once | INTRAMUSCULAR | Status: DC | PRN

## 2020-05-02 MED ORDER — ALBUTEROL SULFATE HFA 108 (90 BASE) MCG/ACT IN AERS: 2.0000 | INHALATION_SPRAY | Freq: Once | RESPIRATORY_TRACT | Status: DC | PRN

## 2020-05-02 NOTE — Patient Instructions (Signed)
Call [facility] for monoclonal antibody infusion.   Please update me with any changes.   It was a pleasure to see you today! Allie Bossier, NP-C

## 2020-05-02 NOTE — Progress Notes (Signed)
  Diagnosis: COVID-19  Physician: Dr Asencion Noble   Procedure: Covid Infusion Clinic Med: casirivimab\imdevimab infusion - Provided patient with casirivimab\imdevimab fact sheet for patients, parents and caregivers prior to infusion.  Complications: No immediate complications noted.  Discharge: Discharged home   [place] 05/02/2020

## 2020-05-02 NOTE — Discharge Instructions (Signed)

## 2020-05-02 NOTE — Progress Notes (Signed)
Subjective:    Patient ID: Gabriel Carroll, male    DOB: 08-13-1966, 54 y.o.   MRN: [id number]  HPI  Virtual Visit via Video Note  I connected with Gabriel Carroll on 05/02/20 at  8:40 AM EDT by a video enabled telemedicine application and verified that I am speaking with the correct person using two identifiers.  Location: Patient: Home Provider: Office Participants: Myself and patient   I discussed the limitations of evaluation and management by telemedicine and the availability of in person appointments. The patient expressed understanding and agreed to proceed.  History of Present Illness:  Gabriel Carroll is a 54 year old male with a history of paroxysmal atrial fibrillation, sleep apnea, pleural effusion who presents today to discuss recent Covid-19 diagnosis.   He was tested through [facility] on 04/29/20 for symptoms of chest congestion, headaches, fatigue. Positive result on 04/30/20. Symptoms began July12th with fatigue, but resolved until July 16th when he developed his currently active symptoms.   He's not sure where he contracted Covid-19, his wife did have surgery in the hospital a few weeks ago. No known contact with Covid-19.   His most bothersome symptom is chest congestion. He was contacted by [facility] yesterday for monoclonal antibody infusion. He was initially confused regarding this so he kindly declined their offer. Last night he realized what the treatment entailed so he plans on contacting them today.   He had both Covid-19 vaccines in April and May 2021. History of bilateral pleural effusions that lead to a surgical procedure, denies lobectomy.    Observations/Objective:  Alert and oriented. Appears well, not sickly. No distress. Speaking in complete sentences. No cough.   Assessment and Plan:  Positive Covid-19 diagnosis despite vaccinations.  Given his history of liver donation, plus pleural effusion, his symptoms may have been much worse without  vaccination.  He will call [facility] now for monoclonal antibody infusion. I strongly advised this.  He remains quarantined and has abided by rules for quarantine. Today he appears stable, no acute distress.  He will update with any changes.   Follow Up Instructions:  Call [facility] for monoclonal antibody infusion.   Please update me with any changes.   It was a pleasure to see you today! Gabriel Bossier, NP-C    I discussed the assessment and treatment plan with the patient. The patient was provided an opportunity to ask questions and all were answered. The patient agreed with the plan and demonstrated an understanding of the instructions.   The patient was advised to call back or seek an in-person evaluation if the symptoms worsen or if the condition fails to improve as anticipated.   Gabriel Koch, NP    Review of Systems  Constitutional: Positive for fatigue. Negative for fever.  HENT: Positive for congestion.   Respiratory: Positive for cough. Negative for shortness of breath.        Chest congestion   Neurological: Positive for headaches.       Past Medical History:  Diagnosis Date   Arthritis    OA left shoulder   Atrial fibrillation (HCC)    Low testosterone    Obesity (BMI 30-39.9)    Pleural effusion    Sleep apnea    no CPAP     Social History   Socioeconomic History   Marital status: Married    Spouse name: Not on file   Number of children: Not on file   Years of education: Not on file  Highest education level: Not on file  Occupational History   Not on file  Tobacco Use   Smoking status: Never Smoker   Smokeless tobacco: Never Used  Substance and Sexual Activity   Alcohol use: Yes    Alcohol/week: 5.0 standard drinks    Types: 5 Cans of beer per week    Comment: occ   Drug use: No   Sexual activity: Not on file  Other Topics Concern   Not on file  Social History Narrative   Married.   2 children.   Works as a  Scientist, forensic.   Enjoys mountain biking, brewing beers, hiking.    Social Determinants of Health   Financial Resource Strain:    Difficulty of Paying Living Expenses:   Food Insecurity:    Worried About Charity fundraiser in the Last Year:    Arboriculturist in the Last Year:   Transportation Needs:    Film/video editor (Medical):    Lack of Transportation (Non-Medical):   Physical Activity:    Days of Exercise per Week:    Minutes of Exercise per Session:   Stress:    Feeling of Stress :   Social Connections:    Frequency of Communication with Friends and Family:    Frequency of Social Gatherings with Friends and Family:    Attends Religious Services:    Active Member of Clubs or Organizations:    Attends Music therapist:    Marital Status:   Intimate Partner Violence:    Fear of Current or Ex-Partner:    Emotionally Abused:    Physically Abused:    Sexually Abused:     Past Surgical History:  Procedure Laterality Date   BUNIONECTOMY     CHOLECYSTECTOMY     KNEE CARTILAGE SURGERY     lt   liver doner     lung surgey     shoulder surgery rt      Family History  Problem Relation Age of Onset   Hypertension Mother    Arthritis Mother    Hypertension Father    Cancer Father    Heart disease Father    Diabetes Paternal Aunt    Diabetes Paternal Uncle    Diabetes Sister    Diabetes Brother    Asthma Maternal Grandmother    Heart disease Maternal Grandfather    Diabetes Paternal Grandmother    Colon cancer Neg Hx    Esophageal cancer Neg Hx    Rectal cancer Neg Hx    Stomach cancer Neg Hx     Allergies  Allergen Reactions   Vancomycin Other (See Comments)    Red man syndrome    Current Outpatient Medications on File Prior to Visit  Medication Sig Dispense Refill   acetaminophen (TYLENOL) 500 MG tablet Take 500 mg by mouth every 6 (six) hours as needed for  mild pain, moderate pain or headache.      clomiPHENE (CLOMID) 50 MG tablet Take 25 mg by mouth daily.      ibuprofen (ADVIL,MOTRIN) 200 MG tablet Take 400 mg by mouth daily as needed.     metoprolol succinate (TOPROL-XL) 25 MG 24 hr tablet Take 1 tablet (25 mg total) by mouth daily. (Patient not taking: Reported on 04/26/2018) 30 tablet 12   No current facility-administered medications on file prior to visit.    There were no vitals taken for this visit.   Objective:  Physical Exam Constitutional:      General: He is not in acute distress.    Appearance: He is not ill-appearing.  Pulmonary:     Effort: Pulmonary effort is normal.     Comments: No cough during visit  Neurological:     Mental Status: He is alert and oriented to person, place, and time.            Assessment & Plan:

## 2020-05-02 NOTE — Assessment & Plan Note (Signed)
Positive Covid-19 diagnosis despite vaccinations.  Given his history of liver donation, plus pleural effusion, his symptoms may have been much worse without vaccination.  He will call [facility] now for monoclonal antibody infusion. I strongly advised this.  He remains quarantined and has abided by rules for quarantine. Today he appears stable, no acute distress.  He will update with any changes.

## 2020-05-02 NOTE — Progress Notes (Signed)
I connected by phone with Gabriel Carroll on 05/02/2020 at 9:06 AM to discuss the potential use of a new treatment for mild to moderate COVID-19 viral infection in non-hospitalized patients.  This patient is a 54 y.o. male that meets the FDA criteria for Emergency Use Authorization of COVID monoclonal antibody casirivimab/imdevimab.  Has a (+) direct SARS-CoV-2 viral test result  Has mild or moderate COVID-19   Is NOT hospitalized due to COVID-19  Is within 10 days of symptom onset  Has at least one of the high risk factor(s) for progression to severe COVID-19 and/or hospitalization as defined in EUA.  Specific high risk criteria : BMI > 25   I have spoken and communicated the following to the patient or parent/caregiver regarding COVID monoclonal antibody treatment:  1. FDA has authorized the emergency use for the treatment of mild to moderate COVID-19 in adults and pediatric patients with positive results of direct SARS-CoV-2 viral testing who are 63 years of age and older weighing at least 40 kg, and who are at high risk for progressing to severe COVID-19 and/or hospitalization.  2. The significant known and potential risks and benefits of COVID monoclonal antibody, and the extent to which such potential risks and benefits are unknown.  3. Information on available alternative treatments and the risks and benefits of those alternatives, including clinical trials.  4. Patients treated with COVID monoclonal antibody should continue to self-isolate and use infection control measures (e.g., wear mask, isolate, social distance, avoid sharing personal items, clean and disinfect "high touch" surfaces, and frequent handwashing) according to CDC guidelines.   5. The patient or parent/caregiver has the option to accept or refuse COVID monoclonal antibody treatment.  After reviewing this information with the patient, The patient agreed to proceed with receiving casirivimab\imdevimab infusion and will  be provided a copy of the Fact sheet prior to receiving the infusion. [name] 05/02/2020 9:06 AM

## 2020-05-03 ENCOUNTER — Telehealth: Payer: Self-pay

## 2020-05-03 ENCOUNTER — Ambulatory Visit (HOSPITAL_COMMUNITY)
Admission: RE | Admit: 2020-05-03 | Discharge: 2020-05-03 | Disposition: A | Discharge status: Home / Self Care | Payer: BC Managed Care – PPO | Source: Ambulatory Visit | Attending: Primary Care | Admitting: Primary Care

## 2020-05-03 DIAGNOSIS — R0989 Other specified symptoms and signs involving the circulatory and respiratory systems: Secondary | ICD-10-CM | POA: Diagnosis not present

## 2020-05-03 DIAGNOSIS — U071 COVID-19: Secondary | ICD-10-CM | POA: Diagnosis not present

## 2020-05-03 NOTE — Telephone Encounter (Signed)
Opened in error

## 2020-05-03 NOTE — Telephone Encounter (Signed)
Pt left a message on triage line, also, about the same thing.

## 2020-05-20 DIAGNOSIS — M79672 Pain in left foot: Secondary | ICD-10-CM | POA: Diagnosis not present

## 2020-05-20 DIAGNOSIS — G5762 Lesion of plantar nerve, left lower limb: Secondary | ICD-10-CM | POA: Diagnosis not present

## 2020-05-28 ENCOUNTER — Ambulatory Visit: Payer: BC Managed Care – PPO | Admitting: Podiatry

## 2020-06-01 DIAGNOSIS — M79672 Pain in left foot: Secondary | ICD-10-CM | POA: Diagnosis not present

## 2020-06-06 DIAGNOSIS — M79672 Pain in left foot: Secondary | ICD-10-CM | POA: Diagnosis not present

## 2020-06-06 DIAGNOSIS — G5762 Lesion of plantar nerve, left lower limb: Secondary | ICD-10-CM | POA: Diagnosis not present

## 2020-07-09 DIAGNOSIS — D2262 Melanocytic nevi of left upper limb, including shoulder: Secondary | ICD-10-CM | POA: Diagnosis not present

## 2020-07-09 DIAGNOSIS — D2261 Melanocytic nevi of right upper limb, including shoulder: Secondary | ICD-10-CM | POA: Diagnosis not present

## 2020-07-09 DIAGNOSIS — L738 Other specified follicular disorders: Secondary | ICD-10-CM | POA: Diagnosis not present

## 2020-07-09 DIAGNOSIS — D2272 Melanocytic nevi of left lower limb, including hip: Secondary | ICD-10-CM | POA: Diagnosis not present

## 2020-07-15 DIAGNOSIS — R351 Nocturia: Secondary | ICD-10-CM | POA: Diagnosis not present

## 2020-07-15 DIAGNOSIS — N400 Enlarged prostate without lower urinary tract symptoms: Secondary | ICD-10-CM | POA: Diagnosis not present

## 2020-07-15 DIAGNOSIS — R972 Elevated prostate specific antigen [PSA]: Secondary | ICD-10-CM | POA: Diagnosis not present

## 2020-07-15 DIAGNOSIS — E291 Testicular hypofunction: Secondary | ICD-10-CM | POA: Diagnosis not present

## 2020-07-31 DIAGNOSIS — M7742 Metatarsalgia, left foot: Secondary | ICD-10-CM | POA: Diagnosis not present

## 2020-07-31 DIAGNOSIS — T8484XA Pain due to internal orthopedic prosthetic devices, implants and grafts, initial encounter: Secondary | ICD-10-CM | POA: Diagnosis not present

## 2020-07-31 DIAGNOSIS — M21612 Bunion of left foot: Secondary | ICD-10-CM | POA: Diagnosis not present

## 2020-08-01 ENCOUNTER — Other Ambulatory Visit (HOSPITAL_COMMUNITY): Payer: Self-pay | Admitting: Orthopedic Surgery

## 2020-08-04 NOTE — Progress Notes (Signed)
Cardiology Office Note:    Date:  08/05/2020   ID:  Rumaldo Difatta, DOB 10/07/1966, MRN [id number]  PCP:  Pleas Koch, NP  Cardiologist:  No primary care provider on file.  Electrophysiologist:  None   Referring MD: Pleas Koch, NP   Chief Complaint  Patient presents with  . Pre-op Exam    History of Present Illness:    Uziah Sorter is a 54 y.o. male with a hx of atrial fibrillation, OSA who is referred by Jerrel Ivory, NP for preoperative evaluation.  Planning foot surgery.  He reports he was diagnosed with atrial fibrillation in 2013.  States that his rates were up to the 170s when he was admitted to the hospital and converted spontaneously.  In 2015 he had another episode of A. fib with rates to 150s and again converted spontaneously.  Following this, began seeing Dr. Wynonia Lawman.  He was prescribed metoprolol to take as needed.  He has not had any known episodes of atrial fibrillation since that time.  Not on anticoagulation.  Reports he was very symptomatic in A. fib with palpitations, denies any recent palpitations.  He denies any chest pain or dyspnea.  Reports he walks every day for 1 to 2 miles and before he tore a ligament in his foot he was running/walking for 5 to 6 miles per day.  Echocardiogram in 05/2015 showed LVEF 55-60%.  Past Medical History:  Diagnosis Date  . Arthritis    OA left shoulder  . Atrial fibrillation ([place])   . Closed displaced fracture of base of fifth metacarpal bone of right hand with routine healing 08/20/2017  . Derangement of posterior horn of lateral meniscus 08/05/2018  . 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 Low testosterone   . Obesity (BMI 30-39.9)   . Pleural effusion   . Procedure and treatment not carried out because of patient's decision for other reasons 08/04/2012  . Sleep apnea    no CPAP    Past Surgical History:  Procedure Laterality Date  . BUNIONECTOMY    . CHOLECYSTECTOMY    . KNEE CARTILAGE SURGERY     lt    . liver doner    . lung surgey    . shoulder surgery rt      Current Medications: Current Meds  Medication Sig  . acetaminophen (TYLENOL) 500 MG tablet Take 500 mg by mouth every 6 (six) hours as needed for mild pain, moderate pain or headache.   . clomiPHENE (CLOMID) 50 MG tablet Take 25 mg by mouth daily.   Marland Kitchen ibuprofen (ADVIL,MOTRIN) 200 MG tablet Take 400 mg by mouth daily as needed.     Allergies:   Vancomycin   Social History   Socioeconomic History  . Marital status: Married    Spouse name: Not on file  . Number of children: Not on file  . Years of education: Not on file  . Highest education level: Not on file  Occupational History  . Not on file  Tobacco Use  . Smoking status: Never Smoker  . Smokeless tobacco: Never Used  Substance and Sexual Activity  . Alcohol use: Yes    Alcohol/week: 5.0 standard drinks    Types: 5 Cans of beer per week    Comment: occ  . Drug use: No  . Sexual activity: Not on file  Other Topics Concern  . Not on file  Social History Narrative   Married.   2 children.   Works as  a Scientist, forensic.   Enjoys mountain biking, brewing beers, hiking.    Social Determinants of Health   Financial Resource Strain:   . Difficulty of Paying Living Expenses: Not on file  Food Insecurity:   . Worried About Charity fundraiser in the Last Year: Not on file  . Ran Out of Food in the Last Year: Not on file  Transportation Needs:   . Lack of Transportation (Medical): Not on file  . Lack of Transportation (Non-Medical): Not on file  Physical Activity:   . Days of Exercise per Week: Not on file  . Minutes of Exercise per Session: Not on file  Stress:   . Feeling of Stress : Not on file  Social Connections:   . Frequency of Communication with Friends and Family: Not on file  . Frequency of Social Gatherings with Friends and Family: Not on file  . Attends Religious Services: Not on file  . Active Member of Clubs  or Organizations: Not on file  . Attends Archivist Meetings: Not on file  . Marital Status: Not on file     Family History: The patient's family history includes Arthritis in his mother; Asthma in his maternal grandmother; Cancer in his father; Diabetes in his brother, paternal aunt, paternal grandmother, paternal uncle, and sister; Heart disease in his father and maternal grandfather; Hypertension in his father and mother. There is no history of Colon cancer, Esophageal cancer, Rectal cancer, or Stomach cancer.  ROS:   Please see the history of present illness.     All other systems reviewed and are negative.  EKGs/Labs/Other Studies Reviewed:    The following studies were reviewed today:   EKG:  EKG is ordered today.  The ekg ordered today demonstrates normal sinus rhythm, rate 72, no ST/T abnormalities  Recent Labs: No results found for requested labs within last 8760 hours.  Recent Lipid Panel    Component Value Date/Time   CHOL 172 04/20/2018 0923   TRIG 72.0 04/20/2018 0923   HDL 45.90 04/20/2018 0923   CHOLHDL 4 04/20/2018 0923   VLDL 14.4 04/20/2018 0923   LDLCALC 111 (H) 04/20/2018 0923    Physical Exam:    VS:  BP 140/76   Pulse 72   Ht 5\' 10"  (1.778 m)   Wt 236 lb (107 kg)   SpO2 95%   BMI 33.86 kg/m     Wt Readings from Last 3 Encounters:  08/05/20 236 lb (107 kg)  04/26/18 217 lb 12 oz (98.8 kg)  09/28/17 229 lb (103.9 kg)     GEN:  Well nourished, well developed in no acute distress HEENT: Normal NECK: No JVD; No carotid bruits LYMPHATICS: No lymphadenopathy CARDIAC: RRR, no murmurs, rubs, gallops RESPIRATORY:  Clear to auscultation without rales, wheezing or rhonchi  ABDOMEN: Soft, non-tender, non-distended MUSCULOSKELETAL:  No edema; No deformity  SKIN: Warm and dry NEUROLOGIC:  Alert and oriented x 3 PSYCHIATRIC:  Normal affect   ASSESSMENT:    1. Pre-op evaluation   2. Atrial fibrillation, unspecified type ([place])   3. OSA  (obstructive sleep apnea)    PLAN:    Preop evaluation: Prior to foot surgery.  No symptoms to suggest active cardiac condition.  Good functional capacity.  RCRI score 0.  Overall would classify as low risk for an intermediate risk surgery and no further cardiac work-up recommended.  Atrial fibrillation: Paroxysmal, reports last known episode in 2015.  No symptoms  to suggest recurrence.  No indication for anticoagulation given CHA2DS2-VASc score 0.  Continue as needed metoprolol.  OSA: was told was mild, not on CPAP.  Has not had sleep study for years.  Recommend repeat sleep study  RTC in 1 year   Medication Adjustments/Labs and Tests Ordered: Current medicines are reviewed at length with the patient today.  Concerns regarding medicines are outlined above.  Orders Placed This Encounter  Procedures  . EKG 12-Lead  . Split night study   No orders of the defined types were placed in this encounter.   Patient Instructions  Medication Instructions:  Your physician recommends that you continue on your current medications as directed. Please refer to the Current Medication list given to you today.  *If you need a refill on your cardiac medications before your next appointment, please call your pharmacy*   Testing/Procedures: Your physician has recommended that you have a sleep study. This test records several body functions during sleep, including: brain activity, eye movement, oxygen and carbon dioxide blood levels, heart rate and rhythm, breathing rate and rhythm, the flow of air through your mouth and nose, snoring, body muscle movements, and chest and belly movement.   Follow-Up: At [facility], you and your health needs are our priority.  As part of our continuing mission to provide you with exceptional heart care, we have created designated Provider Care Teams.  These Care Teams include your primary Cardiologist (physician) and Advanced Practice Providers (APPs -  Physician  Assistants and Nurse Practitioners) who all work together to provide you with the care you need, when you need it.  We recommend signing up for the patient portal called "MyChart".  Sign up information is provided on this After Visit Summary.  MyChart is used to connect with patients for Virtual Visits (Telemedicine).  Patients are able to view lab/test results, encounter notes, upcoming appointments, etc.  Non-urgent messages can be sent to your provider as well.   To learn more about what you can do with MyChart, go to [email or web address].    Your next appointment:   12 month(s)  The format for your next appointment:   In Person  Provider:   Oswaldo Milian, MD   Other Instructions Please check your blood pressure at home daily, write it down.  Call the office or send message via Mychart with the readings in 2 weeks for Dr. Gardiner Rhyme to review.       Signed, Donato Heinz, MD  08/05/2020 5:40 PM    [place]

## 2020-08-05 ENCOUNTER — Encounter: Payer: Self-pay | Admitting: Cardiology

## 2020-08-05 ENCOUNTER — Other Ambulatory Visit: Payer: Self-pay

## 2020-08-05 ENCOUNTER — Ambulatory Visit (INDEPENDENT_AMBULATORY_CARE_PROVIDER_SITE_OTHER): Payer: BC Managed Care – PPO | Admitting: Cardiology

## 2020-08-05 VITALS — BP 140/76 | HR 72 | Ht 70.0 in | Wt 236.0 lb

## 2020-08-05 DIAGNOSIS — I4891 Unspecified atrial fibrillation: Secondary | ICD-10-CM | POA: Diagnosis not present

## 2020-08-05 DIAGNOSIS — Z01818 Encounter for other preprocedural examination: Secondary | ICD-10-CM | POA: Diagnosis not present

## 2020-08-05 DIAGNOSIS — G4733 Obstructive sleep apnea (adult) (pediatric): Secondary | ICD-10-CM

## 2020-08-05 NOTE — Patient Instructions (Signed)
Medication Instructions:  Your physician recommends that you continue on your current medications as directed. Please refer to the Current Medication list given to you today.  *If you need a refill on your cardiac medications before your next appointment, please call your pharmacy*   Testing/Procedures: Your physician has recommended that you have a sleep study. This test records several body functions during sleep, including: brain activity, eye movement, oxygen and carbon dioxide blood levels, heart rate and rhythm, breathing rate and rhythm, the flow of air through your mouth and nose, snoring, body muscle movements, and chest and belly movement.   Follow-Up: At [facility], you and your health needs are our priority.  As part of our continuing mission to provide you with exceptional heart care, we have created designated Provider Care Teams.  These Care Teams include your primary Cardiologist (physician) and Advanced Practice Providers (APPs -  Physician Assistants and Nurse Practitioners) who all work together to provide you with the care you need, when you need it.  We recommend signing up for the patient portal called "MyChart".  Sign up information is provided on this After Visit Summary.  MyChart is used to connect with patients for Virtual Visits (Telemedicine).  Patients are able to view lab/test results, encounter notes, upcoming appointments, etc.  Non-urgent messages can be sent to your provider as well.   To learn more about what you can do with MyChart, go to [email or web address].    Your next appointment:   12 month(s)  The format for your next appointment:   In Person  Provider:   Oswaldo Milian, MD   Other Instructions Please check your blood pressure at home daily, write it down.  Call the office or send message via Mychart with the readings in 2 weeks for Dr. Gardiner Rhyme to review.

## 2020-08-07 ENCOUNTER — Telehealth: Payer: Self-pay | Admitting: Cardiology

## 2020-08-07 NOTE — Telephone Encounter (Signed)
Per insurance no pre-auth required.  Call ref # [id number]

## 2020-08-08 NOTE — Telephone Encounter (Signed)
Called pt and told him his appt was scheduled for Wednesday, Dec. 1 at 8 pm, to expect the info packet and gave him the sleep labs number.

## 2020-08-12 HISTORY — PX: OTHER SURGICAL HISTORY: SHX169

## 2020-08-15 ENCOUNTER — Other Ambulatory Visit: Payer: Self-pay

## 2020-08-15 ENCOUNTER — Encounter (HOSPITAL_BASED_OUTPATIENT_CLINIC_OR_DEPARTMENT_OTHER): Payer: Self-pay | Admitting: Orthopedic Surgery

## 2020-08-19 ENCOUNTER — Other Ambulatory Visit (HOSPITAL_COMMUNITY)
Admission: RE | Admit: 2020-08-19 | Discharge: 2020-08-19 | Disposition: A | Discharge status: Home / Self Care | Payer: BC Managed Care – PPO | Source: Ambulatory Visit | Attending: Orthopedic Surgery | Admitting: Orthopedic Surgery

## 2020-08-19 DIAGNOSIS — M21612 Bunion of left foot: Secondary | ICD-10-CM | POA: Diagnosis not present

## 2020-08-19 DIAGNOSIS — Z9889 Other specified postprocedural states: Secondary | ICD-10-CM | POA: Diagnosis not present

## 2020-08-19 DIAGNOSIS — X58XXXA Exposure to other specified factors, initial encounter: Secondary | ICD-10-CM | POA: Diagnosis not present

## 2020-08-19 DIAGNOSIS — Z20822 Contact with and (suspected) exposure to covid-19: Secondary | ICD-10-CM | POA: Insufficient documentation

## 2020-08-19 DIAGNOSIS — Z01818 Encounter for other preprocedural examination: Secondary | ICD-10-CM | POA: Insufficient documentation

## 2020-08-19 DIAGNOSIS — M25375 Other instability, left foot: Secondary | ICD-10-CM | POA: Diagnosis not present

## 2020-08-19 DIAGNOSIS — Y658 Other specified misadventures during surgical and medical care: Secondary | ICD-10-CM | POA: Diagnosis not present

## 2020-08-19 DIAGNOSIS — T8484XA Pain due to internal orthopedic prosthetic devices, implants and grafts, initial encounter: Secondary | ICD-10-CM | POA: Diagnosis not present

## 2020-08-19 DIAGNOSIS — Y831 Surgical operation with implant of artificial internal device as the cause of abnormal reaction of the patient, or of later complication, without mention of misadventure at the time of the procedure: Secondary | ICD-10-CM | POA: Diagnosis not present

## 2020-08-19 DIAGNOSIS — M7742 Metatarsalgia, left foot: Secondary | ICD-10-CM | POA: Diagnosis not present

## 2020-08-19 DIAGNOSIS — S93692A Other sprain of left foot, initial encounter: Secondary | ICD-10-CM | POA: Diagnosis not present

## 2020-08-19 DIAGNOSIS — Z79818 Long term (current) use of other agents affecting estrogen receptors and estrogen levels: Secondary | ICD-10-CM | POA: Diagnosis not present

## 2020-08-19 DIAGNOSIS — T24102A Burn of first degree of unspecified site of left lower limb, except ankle and foot, initial encounter: Secondary | ICD-10-CM | POA: Diagnosis not present

## 2020-08-19 LAB — SARS CORONAVIRUS 2 (TAT 6-24 HRS): SARS Coronavirus 2: NEGATIVE

## 2020-08-19 NOTE — Progress Notes (Signed)

## 2020-08-20 DIAGNOSIS — M25511 Pain in right shoulder: Secondary | ICD-10-CM | POA: Diagnosis not present

## 2020-08-21 NOTE — Anesthesia Preprocedure Evaluation (Addendum)
Anesthesia Evaluation  Patient identified by MRN, date of birth, ID band Patient awake    Reviewed: Allergy & Precautions, NPO status , Patient's Chart, lab work & pertinent test results  Airway Mallampati: II  TM Distance: >3 FB Neck ROM: Full    Dental no notable dental hx. (+) Teeth Intact, Dental Advisory Given   Pulmonary sleep apnea ,    Pulmonary exam normal breath sounds clear to auscultation       Cardiovascular Exercise Tolerance: Good negative cardio ROS Normal cardiovascular exam Rhythm:Regular Rate:Normal     Neuro/Psych  Neuromuscular disease negative psych ROS   GI/Hepatic Neg liver ROS,   Endo/Other  negative endocrine ROS  Renal/GU negative Renal ROS     Musculoskeletal  (+) Arthritis ,   Abdominal (+) + obese,   Peds  Hematology negative hematology ROS (+)   Anesthesia Other Findings   Reproductive/Obstetrics negative OB ROS                            Anesthesia Physical Anesthesia Plan  ASA: II  Anesthesia Plan: General   Post-op Pain Management:  Regional for Post-op pain   Induction: Intravenous  PONV Risk Score and Plan: 3 and Treatment may vary due to age or medical condition, Ondansetron, Dexamethasone and Midazolam  Airway Management Planned: LMA  Additional Equipment: None  Intra-op Plan:   Post-operative Plan:   Informed Consent: I have reviewed the patients History and Physical, chart, labs and discussed the procedure including the risks, benefits and alternatives for the proposed anesthesia with the patient or authorized representative who has indicated his/her understanding and acceptance.     Dental advisory given  Plan Discussed with: CRNA and Anesthesiologist  Anesthesia Plan Comments: (LMA w L pop block)       Anesthesia Quick Evaluation

## 2020-08-22 ENCOUNTER — Encounter (HOSPITAL_BASED_OUTPATIENT_CLINIC_OR_DEPARTMENT_OTHER): Payer: Self-pay | Admitting: Orthopedic Surgery

## 2020-08-22 ENCOUNTER — Other Ambulatory Visit: Payer: Self-pay

## 2020-08-22 ENCOUNTER — Ambulatory Visit (HOSPITAL_BASED_OUTPATIENT_CLINIC_OR_DEPARTMENT_OTHER): Payer: BC Managed Care – PPO | Admitting: Anesthesiology

## 2020-08-22 ENCOUNTER — Encounter (HOSPITAL_BASED_OUTPATIENT_CLINIC_OR_DEPARTMENT_OTHER)
Admission: RE | Disposition: A | Discharge status: Home / Self Care | Payer: Self-pay | Source: Home / Self Care | Attending: Orthopedic Surgery

## 2020-08-22 ENCOUNTER — Ambulatory Visit (HOSPITAL_BASED_OUTPATIENT_CLINIC_OR_DEPARTMENT_OTHER)
Admission: RE | Admit: 2020-08-22 | Discharge: 2020-08-22 | Disposition: A | Discharge status: Home / Self Care | Payer: BC Managed Care – PPO | Attending: Orthopedic Surgery | Admitting: Orthopedic Surgery

## 2020-08-22 DIAGNOSIS — Z79818 Long term (current) use of other agents affecting estrogen receptors and estrogen levels: Secondary | ICD-10-CM | POA: Diagnosis not present

## 2020-08-22 DIAGNOSIS — M7742 Metatarsalgia, left foot: Secondary | ICD-10-CM | POA: Insufficient documentation

## 2020-08-22 DIAGNOSIS — Y831 Surgical operation with implant of artificial internal device as the cause of abnormal reaction of the patient, or of later complication, without mention of misadventure at the time of the procedure: Secondary | ICD-10-CM | POA: Insufficient documentation

## 2020-08-22 DIAGNOSIS — X58XXXA Exposure to other specified factors, initial encounter: Secondary | ICD-10-CM | POA: Insufficient documentation

## 2020-08-22 DIAGNOSIS — M25375 Other instability, left foot: Secondary | ICD-10-CM | POA: Diagnosis not present

## 2020-08-22 DIAGNOSIS — G8918 Other acute postprocedural pain: Secondary | ICD-10-CM | POA: Diagnosis not present

## 2020-08-22 DIAGNOSIS — M21612 Bunion of left foot: Secondary | ICD-10-CM | POA: Diagnosis not present

## 2020-08-22 DIAGNOSIS — Z9889 Other specified postprocedural states: Secondary | ICD-10-CM | POA: Insufficient documentation

## 2020-08-22 DIAGNOSIS — T24102A Burn of first degree of unspecified site of left lower limb, except ankle and foot, initial encounter: Secondary | ICD-10-CM | POA: Insufficient documentation

## 2020-08-22 DIAGNOSIS — Z20822 Contact with and (suspected) exposure to covid-19: Secondary | ICD-10-CM | POA: Insufficient documentation

## 2020-08-22 DIAGNOSIS — S93692A Other sprain of left foot, initial encounter: Secondary | ICD-10-CM | POA: Insufficient documentation

## 2020-08-22 DIAGNOSIS — T8484XA Pain due to internal orthopedic prosthetic devices, implants and grafts, initial encounter: Secondary | ICD-10-CM | POA: Diagnosis not present

## 2020-08-22 DIAGNOSIS — M25372 Other instability, left ankle: Secondary | ICD-10-CM | POA: Diagnosis not present

## 2020-08-22 DIAGNOSIS — Y658 Other specified misadventures during surgical and medical care: Secondary | ICD-10-CM | POA: Insufficient documentation

## 2020-08-22 DIAGNOSIS — T8484XD Pain due to internal orthopedic prosthetic devices, implants and grafts, subsequent encounter: Secondary | ICD-10-CM | POA: Diagnosis not present

## 2020-08-22 HISTORY — PX: METATARSAL OSTEOTOMY: SHX1641

## 2020-08-22 HISTORY — PX: LIGAMENT REPAIR: SHX5444

## 2020-08-22 HISTORY — PX: WEIL OSTEOTOMY: SHX5044

## 2020-08-22 HISTORY — PX: HARDWARE REMOVAL: SHX979

## 2020-08-22 SURGERY — REMOVAL, HARDWARE
Anesthesia: General | Site: Foot | Laterality: Left

## 2020-08-22 MED ORDER — CEFAZOLIN SODIUM-DEXTROSE 2-4 GM/100ML-% IV SOLN
2.0000 g | INTRAVENOUS | Status: AC
  Administered 2020-08-22: 2 g via INTRAVENOUS

## 2020-08-22 MED ORDER — PROMETHAZINE HCL 25 MG/ML IJ SOLN: 6.2500 mg | INTRAMUSCULAR | Status: DC | PRN

## 2020-08-22 MED ORDER — LACTATED RINGERS IV SOLN: INTRAVENOUS | Status: DC

## 2020-08-22 MED ORDER — HYDROCODONE-ACETAMINOPHEN 7.5-325 MG PO TABS: 1.0000 | ORAL_TABLET | Freq: Once | ORAL | Status: DC | PRN

## 2020-08-22 MED ORDER — FENTANYL CITRATE (PF) 100 MCG/2ML IJ SOLN
INTRAMUSCULAR | Status: AC
  Filled 2020-08-22: qty 2

## 2020-08-22 MED ORDER — ONDANSETRON HCL 4 MG/2ML IJ SOLN
INTRAMUSCULAR | Status: AC
  Filled 2020-08-22: qty 18

## 2020-08-22 MED ORDER — BUPIVACAINE-EPINEPHRINE (PF) 0.5% -1:200000 IJ SOLN
INTRAMUSCULAR | Status: DC | PRN
  Administered 2020-08-22 (×2): 16 mL via PERINEURAL

## 2020-08-22 MED ORDER — MIDAZOLAM HCL 2 MG/2ML IJ SOLN
2.0000 mg | Freq: Once | INTRAMUSCULAR | Status: AC
  Administered 2020-08-22: 2 mg via INTRAVENOUS

## 2020-08-22 MED ORDER — HYDROMORPHONE HCL 1 MG/ML IJ SOLN: 0.2500 mg | INTRAMUSCULAR | Status: DC | PRN

## 2020-08-22 MED ORDER — DEXMEDETOMIDINE (PRECEDEX) IN NS 20 MCG/5ML (4 MCG/ML) IV SYRINGE
PREFILLED_SYRINGE | INTRAVENOUS | Status: AC
  Filled 2020-08-22: qty 5

## 2020-08-22 MED ORDER — PROPOFOL 10 MG/ML IV BOLUS
INTRAVENOUS | Status: DC | PRN
  Administered 2020-08-22: 200 mg via INTRAVENOUS

## 2020-08-22 MED ORDER — VANCOMYCIN HCL 500 MG IV SOLR
INTRAVENOUS | Status: AC
  Filled 2020-08-22: qty 1000

## 2020-08-22 MED ORDER — OXYCODONE HCL 5 MG PO TABS
5.0000 mg | ORAL_TABLET | ORAL | 0 refills | Status: AC | PRN
Start: 2020-08-22 — End: 2020-08-27

## 2020-08-22 MED ORDER — VANCOMYCIN HCL 500 MG IV SOLR
INTRAVENOUS | Status: DC | PRN
  Administered 2020-08-22: 500 mg

## 2020-08-22 MED ORDER — CEFAZOLIN SODIUM-DEXTROSE 2-4 GM/100ML-% IV SOLN
INTRAVENOUS | Status: AC
  Filled 2020-08-22: qty 100

## 2020-08-22 MED ORDER — ONDANSETRON HCL 4 MG/2ML IJ SOLN
INTRAMUSCULAR | Status: DC | PRN
  Administered 2020-08-22: 4 mg via INTRAVENOUS

## 2020-08-22 MED ORDER — LIDOCAINE 2% (20 MG/ML) 5 ML SYRINGE
INTRAMUSCULAR | Status: AC
  Filled 2020-08-22: qty 20

## 2020-08-22 MED ORDER — LIDOCAINE HCL 2 % IJ SOLN
INTRAMUSCULAR | Status: AC
  Filled 2020-08-22: qty 20

## 2020-08-22 MED ORDER — SENNA 8.6 MG PO TABS: 2.0000 | ORAL_TABLET | Freq: Two times a day (BID) | ORAL | 0 refills | Status: DC

## 2020-08-22 MED ORDER — PHENYLEPHRINE 40 MCG/ML (10ML) SYRINGE FOR IV PUSH (FOR BLOOD PRESSURE SUPPORT)
PREFILLED_SYRINGE | INTRAVENOUS | Status: AC
  Filled 2020-08-22: qty 10

## 2020-08-22 MED ORDER — DOCUSATE SODIUM 100 MG PO CAPS: 100.0000 mg | ORAL_CAPSULE | Freq: Two times a day (BID) | ORAL | 0 refills | Status: DC

## 2020-08-22 MED ORDER — FENTANYL CITRATE (PF) 100 MCG/2ML IJ SOLN
100.0000 ug | Freq: Once | INTRAMUSCULAR | Status: AC
  Administered 2020-08-22: 100 ug via INTRAVENOUS

## 2020-08-22 MED ORDER — ACETAMINOPHEN 10 MG/ML IV SOLN: 1000.0000 mg | Freq: Once | INTRAVENOUS | Status: DC | PRN

## 2020-08-22 MED ORDER — PROPOFOL 10 MG/ML IV BOLUS
INTRAVENOUS | Status: AC
  Filled 2020-08-22: qty 20

## 2020-08-22 MED ORDER — EPHEDRINE 5 MG/ML INJ
INTRAVENOUS | Status: AC
  Filled 2020-08-22: qty 20

## 2020-08-22 MED ORDER — DEXAMETHASONE SODIUM PHOSPHATE 10 MG/ML IJ SOLN
INTRAMUSCULAR | Status: AC
  Filled 2020-08-22: qty 2

## 2020-08-22 MED ORDER — ROPIVACAINE HCL 7.5 MG/ML IJ SOLN
INTRAMUSCULAR | Status: DC | PRN
  Administered 2020-08-22: 20 mL via PERINEURAL

## 2020-08-22 MED ORDER — MEPERIDINE HCL 25 MG/ML IJ SOLN: 6.2500 mg | INTRAMUSCULAR | Status: DC | PRN

## 2020-08-22 MED ORDER — LIDOCAINE 2% (20 MG/ML) 5 ML SYRINGE
INTRAMUSCULAR | Status: DC | PRN
  Administered 2020-08-22: 30 mg via INTRAVENOUS

## 2020-08-22 MED ORDER — PROPOFOL 500 MG/50ML IV EMUL
INTRAVENOUS | Status: DC | PRN
  Administered 2020-08-22: 25 ug/kg/min via INTRAVENOUS

## 2020-08-22 MED ORDER — SODIUM CHLORIDE 0.9 % IV SOLN: INTRAVENOUS | Status: DC

## 2020-08-22 MED ORDER — PROPOFOL 500 MG/50ML IV EMUL
INTRAVENOUS | Status: AC
  Filled 2020-08-22: qty 150

## 2020-08-22 MED ORDER — MIDAZOLAM HCL 2 MG/2ML IJ SOLN
INTRAMUSCULAR | Status: AC
  Filled 2020-08-22: qty 2

## 2020-08-22 SURGICAL SUPPLY — 92 items
APL PRP STRL LF DISP 70% ISPRP (MISCELLANEOUS) ×4
APL SKNCLS STERI-STRIP NONHPOA (GAUZE/BANDAGES/DRESSINGS)
BANDAGE ESMARK 6X9 LF (GAUZE/BANDAGES/DRESSINGS) IMPLANT
BENZOIN TINCTURE PRP APPL 2/3 (GAUZE/BANDAGES/DRESSINGS) IMPLANT
BIT DRILL CANN 2.4 (BIT) ×3
BIT DRILL CANN 2.4MM (BIT) ×1
BIT DRILL CANN MAX VPC 2.4 (BIT) ×2 IMPLANT
BIT DRILL PRFL CANNULTD 3.4MM (BIT) ×2 IMPLANT
BLADE AVERAGE 25MMX9MM (BLADE) ×1
BLADE AVERAGE 25X9 (BLADE) ×3 IMPLANT
BLADE LONG MED 25X9 (BLADE) IMPLANT
BLADE LONG MED 25X9MM (BLADE)
BLADE SURG 15 STRL LF DISP TIS (BLADE) ×8 IMPLANT
BLADE SURG 15 STRL SS (BLADE) ×16
BNDG CMPR 9X4 STRL LF SNTH (GAUZE/BANDAGES/DRESSINGS)
BNDG CMPR 9X6 STRL LF SNTH (GAUZE/BANDAGES/DRESSINGS)
BNDG COHESIVE 4X5 TAN STRL (GAUZE/BANDAGES/DRESSINGS) IMPLANT
BNDG COHESIVE 6X5 TAN STRL LF (GAUZE/BANDAGES/DRESSINGS) IMPLANT
BNDG CONFORM 2 STRL LF (GAUZE/BANDAGES/DRESSINGS) IMPLANT
BNDG CONFORM 3 STRL LF (GAUZE/BANDAGES/DRESSINGS) ×4 IMPLANT
BNDG ELASTIC 4X5.8 VLCR STR LF (GAUZE/BANDAGES/DRESSINGS) IMPLANT
BNDG ESMARK 4X9 LF (GAUZE/BANDAGES/DRESSINGS) IMPLANT
BNDG ESMARK 6X9 LF (GAUZE/BANDAGES/DRESSINGS)
CHLORAPREP W/TINT 26 (MISCELLANEOUS) ×8 IMPLANT
CLOSURE WOUND 1/2 X4 (GAUZE/BANDAGES/DRESSINGS)
COVER BACK TABLE 60X90IN (DRAPES) ×4 IMPLANT
COVER MAYO STAND STRL (DRAPES) ×4 IMPLANT
COVER WAND RF STERILE (DRAPES) IMPLANT
CUFF TOURN SGL QUICK 24 (TOURNIQUET CUFF)
CUFF TOURN SGL QUICK 34 (TOURNIQUET CUFF) ×4
CUFF TRNQT CYL 24X4X16.5-23 (TOURNIQUET CUFF) IMPLANT
CUFF TRNQT CYL 34X4.125X (TOURNIQUET CUFF) ×2 IMPLANT
DECANTER SPIKE VIAL GLASS SM (MISCELLANEOUS) IMPLANT
DRAPE EXTREMITY T 121X128X90 (DISPOSABLE) ×4 IMPLANT
DRAPE INCISE IOBAN 66X45 STRL (DRAPES) ×4 IMPLANT
DRAPE OEC MINIVIEW 54X84 (DRAPES) ×4 IMPLANT
DRAPE SURG 17X23 STRL (DRAPES) IMPLANT
DRAPE U-SHAPE 47X51 STRL (DRAPES) ×4 IMPLANT
DRILL PROFILE CANNULATED 3.4MM (BIT) ×4
DRSG MEPITEL 4X7.2 (GAUZE/BANDAGES/DRESSINGS) ×4 IMPLANT
DRSG PAD ABDOMINAL 8X10 ST (GAUZE/BANDAGES/DRESSINGS) IMPLANT
ELECT REM PT RETURN 9FT ADLT (ELECTROSURGICAL) ×4
ELECTRODE REM PT RTRN 9FT ADLT (ELECTROSURGICAL) ×2 IMPLANT
GAUZE SPONGE 4X4 12PLY STRL (GAUZE/BANDAGES/DRESSINGS) ×4 IMPLANT
GLOVE BIO SURGEON STRL SZ8 (GLOVE) ×4 IMPLANT
GLOVE BIOGEL PI IND STRL 8 (GLOVE) ×4 IMPLANT
GLOVE BIOGEL PI INDICATOR 8 (GLOVE) ×4
GLOVE ECLIPSE 8.0 STRL XLNG CF (GLOVE) ×4 IMPLANT
GLOVE SURG SS PI 7.0 STRL IVOR (GLOVE) ×4 IMPLANT
GOWN STRL REUS W/ TWL LRG LVL3 (GOWN DISPOSABLE) ×2 IMPLANT
GOWN STRL REUS W/ TWL XL LVL3 (GOWN DISPOSABLE) ×4 IMPLANT
GOWN STRL REUS W/TWL LRG LVL3 (GOWN DISPOSABLE) ×4
GOWN STRL REUS W/TWL XL LVL3 (GOWN DISPOSABLE) ×8
K-WIRE COCR 1.1X105 (WIRE) ×4
KWIRE COCR 1.1X105 (WIRE) ×2 IMPLANT
NEEDLE HYPO 22GX1.5 SAFETY (NEEDLE) IMPLANT
NS IRRIG 1000ML POUR BTL (IV SOLUTION) ×4 IMPLANT
PACK BASIN DAY SURGERY FS (CUSTOM PROCEDURE TRAY) ×4 IMPLANT
PAD CAST 4YDX4 CTTN HI CHSV (CAST SUPPLIES) ×2 IMPLANT
PADDING CAST COTTON 4X4 STRL (CAST SUPPLIES) ×4
PADDING CAST COTTON 6X4 STRL (CAST SUPPLIES) IMPLANT
PENCIL SMOKE EVACUATOR (MISCELLANEOUS) ×4 IMPLANT
SANITIZER HAND PURELL 535ML FO (MISCELLANEOUS) ×4 IMPLANT
SCREW CANN MAX VPC 3.4X18 (Screw) ×2 IMPLANT
SCREW HCS TWIST-OFF 2.0X12MM (Screw) ×4 IMPLANT
SCREW MAX VPC 3.4X24MM (Screw) ×4 IMPLANT
SCREW VCP 3.4X32MM (Screw) ×4 IMPLANT
SCREW VPS 3.4X18MM (Screw) ×2 IMPLANT
SHEET MEDIUM DRAPE 40X70 STRL (DRAPES) ×4 IMPLANT
SLEEVE SCD COMPRESS KNEE MED (MISCELLANEOUS) ×4 IMPLANT
SPLINT FAST PLASTER 5X30 (CAST SUPPLIES)
SPLINT PLASTER CAST FAST 5X30 (CAST SUPPLIES) IMPLANT
SPONGE LAP 18X18 RF (DISPOSABLE) ×4 IMPLANT
STOCKINETTE 6  STRL (DRAPES) ×2
STOCKINETTE 6 STRL (DRAPES) ×2 IMPLANT
STRIP CLOSURE SKIN 1/2X4 (GAUZE/BANDAGES/DRESSINGS) IMPLANT
SUCTION FRAZIER HANDLE 10FR (MISCELLANEOUS) ×2
SUCTION TUBE FRAZIER 10FR DISP (MISCELLANEOUS) ×2 IMPLANT
SUT ETHILON 3 0 PS 1 (SUTURE) ×8 IMPLANT
SUT MNCRL AB 3-0 PS2 18 (SUTURE) ×4 IMPLANT
SUT VIC AB 0 SH 27 (SUTURE) IMPLANT
SUT VIC AB 2-0 SH 18 (SUTURE) IMPLANT
SUT VIC AB 2-0 SH 27 (SUTURE)
SUT VIC AB 2-0 SH 27XBRD (SUTURE) IMPLANT
SUT VICRYL 0 UR6 27IN ABS (SUTURE) ×4 IMPLANT
SYR BULB EAR ULCER 3OZ GRN STR (SYRINGE) ×4 IMPLANT
SYR CONTROL 10ML LL (SYRINGE) IMPLANT
TOWEL GREEN STERILE FF (TOWEL DISPOSABLE) ×4 IMPLANT
TUBE CONNECTING 20'X1/4 (TUBING) ×1
TUBE CONNECTING 20X1/4 (TUBING) ×3 IMPLANT
UNDERPAD 30X36 HEAVY ABSORB (UNDERPADS AND DIAPERS) ×4 IMPLANT
YANKAUER SUCT BULB TIP NO VENT (SUCTIONS) IMPLANT

## 2020-08-22 NOTE — Anesthesia Postprocedure Evaluation (Signed)
Anesthesia Post Note  Patient: Gabriel Carroll  Procedure(s) Performed: Left foot 1st metatarsal removal of deep implant (Left Foot) 1st metatarsal Scarf osteotomy; 2nd metatarsal Weil osteotomy; repair of lateral collateral ligament (Left Foot) LIGAMENT REPAIR (Foot) METATARSAL OSTEOTOMY (Foot)     Patient location during evaluation: PACU Anesthesia Type: General Level of consciousness: awake and alert Pain management: pain level controlled Vital Signs Assessment: post-procedure vital signs reviewed and stable Respiratory status: spontaneous breathing, nonlabored ventilation, respiratory function stable and patient connected to nasal cannula oxygen Cardiovascular status: blood pressure returned to baseline and stable Postop Assessment: no apparent nausea or vomiting Anesthetic complications: no   No complications documented.  Last Vitals:  Vitals:   08/22/20 1116 08/22/20 1157  BP:  130/82  Pulse: 61 62  Resp: 14 16  Temp:  36.4 C  SpO2: 92% 96%    Last Pain:  Vitals:   08/22/20 1157  PainSc: 2                  Barnet Glasgow

## 2020-08-22 NOTE — Discharge Instructions (Addendum)
Post Anesthesia Home Care Instructions  Activity: Get plenty of rest for the remainder of the day. A responsible individual must stay with you for 24 hours following the procedure.  For the next 24 hours, DO NOT: -Drive a car -Paediatric nurse -Drink alcoholic beverages -Take any medication unless instructed by your physician -Make any legal decisions or sign important papers.  Meals: Start with liquid foods such as gelatin or soup. Progress to regular foods as tolerated. Avoid greasy, spicy, heavy foods. If nausea and/or vomiting occur, drink only clear liquids until the nausea and/or vomiting subsides. Call your physician if vomiting continues.  Special Instructions/Symptoms: Your throat may feel dry or sore from the anesthesia or the breathing tube placed in your throat during surgery. If this causes discomfort, gargle with warm salt water. The discomfort should disappear within 24 hours.  If you had a scopolamine patch placed behind your ear for the management of post- operative nausea and/or vomiting:  1. The medication in the patch is effective for 72 hours, after which it should be removed.  Wrap patch in a tissue and discard in the trash. Wash hands thoroughly with soap and water. 2. You may remove the patch earlier than 72 hours if you experience unpleasant side effects which may include dry mouth, dizziness or visual disturbances. 3. Avoid touching the patch. Wash your hands with soap and water after contact with the patch.     Post Anesthesia Home Care Instructions  Activity: Get plenty of rest for the remainder of the day. A responsible individual must stay with you for 24 hours following the procedure.  For the next 24 hours, DO NOT: -Drive a car -Paediatric nurse -Drink alcoholic beverages -Take any medication unless instructed by your physician -Make any legal decisions or sign important papers.  Meals: Start with liquid foods such as gelatin or soup. Progress  to regular foods as tolerated. Avoid greasy, spicy, heavy foods. If nausea and/or vomiting occur, drink only clear liquids until the nausea and/or vomiting subsides. Call your physician if vomiting continues.  Special Instructions/Symptoms: Your throat may feel dry or sore from the anesthesia or the breathing tube placed in your throat during surgery. If this causes discomfort, gargle with warm salt water. The discomfort should disappear within 24 hours.  If you had a scopolamine patch placed behind your ear for the management of post- operative nausea and/or vomiting:  1. The medication in the patch is effective for 72 hours, after which it should be removed.  Wrap patch in a tissue and discard in the trash. Wash hands thoroughly with soap and water. 2. You may remove the patch earlier than 72 hours if you experience unpleasant side effects which may include dry mouth, dizziness or visual disturbances. 3. Avoid touching the patch. Wash your hands with soap and water after contact with the patch.     Post Anesthesia Home Care Instructions  Activity: Get plenty of rest for the remainder of the day. A responsible individual must stay with you for 24 hours following the procedure.  For the next 24 hours, DO NOT: -Drive a car -Paediatric nurse -Drink alcoholic beverages -Take any medication unless instructed by your physician -Make any legal decisions or sign important papers.  Meals: Start with liquid foods such as gelatin or soup. Progress to regular foods as tolerated. Avoid greasy, spicy, heavy foods. If nausea and/or vomiting occur, drink only clear liquids until the nausea and/or vomiting subsides. Call your physician if vomiting continues.  Special  Instructions/Symptoms: Your throat may feel dry or sore from the anesthesia or the breathing tube placed in your throat during surgery. If this causes discomfort, gargle with warm salt water. The discomfort should disappear within 24  hours.  If you had a scopolamine patch placed behind your ear for the management of post- operative nausea and/or vomiting:  1. The medication in the patch is effective for 72 hours, after which it should be removed.  Wrap patch in a tissue and discard in the trash. Wash hands thoroughly with soap and water. 2. You may remove the patch earlier than 72 hours if you experience unpleasant side effects which may include dry mouth, dizziness or visual disturbances. 3. Avoid touching the patch. Wash your hands with soap and water after contact with the patch.    Regional Anesthesia Blocks  1. Numbness or the inability to move the "blocked" extremity may last from 3-48 hours after placement. The length of time depends on the medication injected and your individual response to the medication. If the numbness is not going away after 48 hours, call your surgeon.  2. The extremity that is blocked will need to be protected until the numbness is gone and the  Strength has returned. Because you cannot feel it, you will need to take extra care to avoid injury. Because it may be weak, you may have difficulty moving it or using it. You may not know what position it is in without looking at it while the block is in effect.  3. For blocks in the legs and feet, returning to weight bearing and walking needs to be done carefully. You will need to wait until the numbness is entirely gone and the strength has returned. You should be able to move your leg and foot normally before you try and bear weight or walk. You will need someone to be with you when you first try to ensure you do not fall and possibly risk injury.  4. Bruising and tenderness at the needle site are common side effects and will resolve in a few days.  5. Persistent numbness or new problems with movement should be communicated to the surgeon or the [place] [phone] [place] [phone]).Regional Anesthesia  Blocks  1. Numbness or the inability to move the "blocked" extremity may last from 3-48 hours after placement. The length of time depends on the medication injected and your individual response to the medication. If the numbness is not going away after 48 hours, call your surgeon.  2. The extremity that is blocked will need to be protected until the numbness is gone and the  Strength has returned. Because you cannot feel it, you will need to take extra care to avoid injury. Because it may be weak, you may have difficulty moving it or using it. You may not know what position it is in without looking at it while the block is in effect.  3. For blocks in the legs and feet, returning to weight bearing and walking needs to be done carefully. You will need to wait until the numbness is entirely gone and the strength has returned. You should be able to move your leg and foot normally before you try and bear weight or walk. You will need someone to be with you when you first try to ensure you do not fall and possibly risk injury.  4. Bruising and tenderness at the needle site are common side effects and will resolve in a  few days.  5. Persistent numbness or new problems with movement should be communicated to the surgeon or the [place] [phone] [place] [phone]).    Wylene Simmer, MD [facility]  Please read the following information regarding your care after surgery.  Medications  You only need a prescription for the narcotic pain medicine (ex. oxycodone, Percocet, Norco).  All of the other medicines listed below are available over the counter. X Aleve 2 pills twice a day for the first 3 days after surgery. X acetominophen (Tylenol) 650 mg every 4-6 hours as you need for minor to moderate pain X oxycodone as prescribed for severe pain  Narcotic pain medicine (ex. oxycodone, Percocet, Vicodin) will cause constipation.  To prevent this problem, take the  following medicines while you are taking any pain medicine. X docusate sodium (Colace) 100 mg twice a day X senna (Senokot) 2 tablets twice a day  Weight Bearing X Bear weight only on your operated foot in the post-op shoe.  Cast / Splint / Dressing X Keep your splint, cast or dressing clean and dry.  Don't put anything (coat hanger, pencil, etc) down inside of it.  If it gets damp, use a hair dryer on the cool setting to dry it.  If it gets soaked, call the office to schedule an appointment for a cast change.    After your dressing, cast or splint is removed; you may shower, but do not soak or scrub the wound.  Allow the water to run over it, and then gently pat it dry.  Swelling It is normal for you to have swelling where you had surgery.  To reduce swelling and pain, keep your toes above your nose for at least 3 days after surgery.  It may be necessary to keep your foot or leg elevated for several weeks.  If it hurts, it should be elevated.  Follow Up Call my office at [phone] when you are discharged from the hospital or surgery center to schedule an appointment to be seen two weeks after surgery.  Call my office at [phone] if you develop a fever >101.5 F, nausea, vomiting, bleeding from the surgical site or severe pain.

## 2020-08-22 NOTE — Progress Notes (Signed)
Assisted Dr. Valma Cava with left, ultrasound guided, popliteal block. Side rails up, monitors on throughout procedure. See vital signs in flow sheet. Tolerated Procedure well.

## 2020-08-22 NOTE — Op Note (Signed)
08/22/2020  10:38 AM  PATIENT:  Gabriel Carroll  54 y.o. male  PRE-OPERATIVE DIAGNOSIS:   1.  left foot bunion      2.  Left 1st MT painful hardware      3.  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  Left first metatarsal removal of deep implant 2.  Correction of left foot bunion deformity with double osteotomy (scarf/Akin) 3.  Left second metatarsal Weil osteotomy through separate incision 4.  Left second MTP joint lateral collateral ligament repair 5.  AP and lateral radiographs of the left foot  SURGEON:  Wylene Simmer, MD  ASSISTANT: Mechele Claude, PA-C  ANESTHESIA:   General, regional  EBL:  minimal   TOURNIQUET:   Total Tourniquet Time Documented: Thigh (Left) - 87 minutes Total: Thigh (Left) - 87 minutes  COMPLICATIONS: Superficial burn to the left medial leg just proximal to the ankle  DISPOSITION:  Extubated, awake and stable to recovery.  INDICATION FOR PROCEDURE: The patient is a 54 year old male without significant past medical history.  He has a history of left foot bunion correction years ago by a podiatrist.  This bunion has recurred.  He also has second MTP joint pain with some instability and signs and symptoms of metatarsalgia.  He has failed nonoperative treatment to date including activity modification, oral anti-inflammatories and shoewear modification.  He presents now for operative treatment of these painful left forefoot deformities.  The risks and benefits of the alternative treatment options have been discussed in detail.  The patient wishes to proceed with surgery and specifically understands risks of bleeding, infection, nerve damage, blood clots, need for additional surgery, amputation and death.  PROCEDURE IN DETAIL:  After pre operative consent was obtained, and the correct operative site was identified, the patient was brought to the operating room and placed supine on the OR table.  Anesthesia  was administered.  Pre-operative antibiotics were administered.  A surgical timeout was taken.  The left lower extremity was prepped and draped in standard sterile fashion with a tourniquet around the thigh.  The extremity was elevated and the tourniquet was inflated to 250 mmHg.  A longitudinal incision was made just medial from the second MTP joint.  Dissection was carried down through the subcutaneous tissues.  The intermetatarsal ligament was identified.  Was divided under direct vision.  An arthrotomy was then made between the lateral sesamoid and the first metatarsal head.  Small perforations were made in the lateral joint capsule.  The hallux could then be positioned in slight varus passively.  Attention was turned to the medial forefoot where a longitudinal incision was made over the medial eminence.  Dissection was carried down through the subcutaneous tissues.  The medial joint capsule was incised and elevated dorsally and plantarly.  Hypertrophic medial eminence was identified.  It was resected in line with the first metatarsal shaft.  The screw head was identified in the dorsal aspect of the first metatarsal.  A rondure was used to clean all of the overgrown bone and fibrous tissue away from the screw head.  The screwdriver was used to remove the screw in its entirety.  2 K wires were then placed in the first metatarsal shaft to mark the corners of a scarf osteotomy.  The osteotomy was cut with an oscillating saw all removing a small wedge of bone proximally.  The proximal and distal fragments were mobilized.  The metatarsal head was shifted laterally to correct the  hallux valgus and intermetatarsal angles.  Radiographs confirmed appropriate position of the bone.  The osteotomy was fixed with 2 3.4 mm [employer] VPC screws.  Overhanging bone was then trimmed with the oscillating saw.  On weightbearing examination the patient still had a bit of hallux valgus interphalangeus.  The decision was made to  proceed with an Akin osteotomy.  Subperiosteal dissection was then carried dorsally and plantarly to the proximal phalanx.  The flexor and extensor tendons were protected.  A closing wedge osteotomy was then cut in the medial cortex.  The osteotomy was closed and fixed with a 3.4 mm VPC screw.  Radiographs confirmed correction of the hallux valgus and intermetatarsal angles.  Attention was returned to the dorsal incision.  Dissection was carried over to the second MTP joint.  The extensor tendons were protected.  The dorsal joint capsule was excised exposing the metatarsal head.  A Weil osteotomy was cut with the oscillating saw.  The head of the metatarsal was allowed to retract proximally.  This allowed exposure of the plantar plate for careful examination.  There was no evidence of any plantar plate tear.  The lateral collateral ligament was noted to be torn.  The tear was approximated and a 0 Vicryl box suture placed and tagged.  The Weil osteotomy was then fixed with a 2 mm FRS screw.  The toe was reduced to an appropriate position and the suture tied.  Final AP and lateral radiographs confirmed appropriate position and length of all hardware and appropriate correction of the first and second ray deformities.  Wounds were irrigated copiously and sprinkled with vancomycin powder.  The medial joint capsule was repaired with imbricating sutures of 2-0 Vicryl.  Subcutaneous tissues were approximated with Monocryl.  Skin incisions were closed with nylon.  Sterile dressings were applied followed by a bunion dressing.  The tourniquet was released after application of the dressings.  The patient was awakened from anesthesia and transported to the recovery room in stable condition.  During the case a small burn was created when the cautery was inadvertently activated.  This burn occurred at the medial leg just above the ankle and measured approximately 3 mm.  There was no evident involvement deep to the skin layer.   This wound was dressed appropriately.   FOLLOW UP PLAN: Weightbearing as tolerated on the heel in a postop shoe.  Follow-up in the office in 2 weeks for suture removal and toe spacer.   RADIOGRAPHS: AP and lateral radiographs of the left foot are obtained intraoperatively.  These show interval correction of the bunion deformity and shortening of the second metatarsal.  Hardware is appropriately positioned and of the appropriate lengths.  No other deformities are noted.    Mechele Claude PA-C was present and scrubbed for the duration of the operative case. His assistance was essential in positioning the patient, prepping and draping, gaining and maintaining exposure, performing the operation, closing and dressing the wounds and applying the splint.

## 2020-08-22 NOTE — Anesthesia Procedure Notes (Signed)
Anesthesia Regional Block: Popliteal block   Pre-Anesthetic Checklist: ,, timeout performed, Correct Patient, Correct Site, Correct Laterality, Correct Procedure, Correct Position, site marked, Risks and benefits discussed, pre-op evaluation,  At surgeon's request and post-op pain management  Laterality: Left and Lower  Prep: Maximum Sterile Barrier Precautions used, chloraprep       Needles:  Injection technique: Single-shot  Needle Type: Echogenic Needle     Needle Length: 9cm  Needle Gauge: 21     Additional Needles:   Procedures:,,,, ultrasound used (permanent image in chart),,,,  Narrative:  Start time: 08/22/2020 8:10 AM End time: 08/22/2020 8:20 AM Injection made incrementally with aspirations every 5 mL.  Performed by: Personally  Anesthesiologist: Barnet Glasgow, MD  Additional Notes: Block assessed. Patient tolerated procedure well.

## 2020-08-22 NOTE — Anesthesia Procedure Notes (Signed)
Procedure Name: LMA Insertion Date/Time: 08/22/2020 8:53 AM Performed by: Signe Colt, CRNA Pre-anesthesia Checklist: Patient identified, Emergency Drugs available, Suction available and Patient being monitored Patient Re-evaluated:Patient Re-evaluated prior to induction Oxygen Delivery Method: Circle System Utilized Preoxygenation: Pre-oxygenation with 100% oxygen Induction Type: IV induction Ventilation: Mask ventilation without difficulty LMA: LMA inserted LMA Size: 4.0 Number of attempts: 1 Airway Equipment and Method: bite block Placement Confirmation: positive ETCO2 Tube secured with: Tape Dental Injury: Teeth and Oropharynx as per pre-operative assessment

## 2020-08-22 NOTE — Transfer of Care (Signed)
Immediate Anesthesia Transfer of Care Note  Patient: Gabriel Carroll  Procedure(s) Performed: Left foot 1st metatarsal removal of deep implant (Left Foot) 1st metatarsal Scarf osteotomy; 2nd metatarsal Weil osteotomy; repair of lateral collateral ligament (Left Foot) LIGAMENT REPAIR (Foot) METATARSAL OSTEOTOMY (Foot)  Patient Location: PACU  Anesthesia Type:GA combined with regional for post-op pain  Level of Consciousness: drowsy and patient cooperative  Airway & Oxygen Therapy: Patient Spontanous Breathing and Patient connected to face mask oxygen  Post-op Assessment: Report given to RN and Post -op Vital signs reviewed and stable  Post vital signs: Reviewed and stable  Last Vitals:  Vitals Value Taken Time  BP    Temp    Pulse 74 08/22/20 1035  Resp 15 08/22/20 1035  SpO2 99 % 08/22/20 1035  Vitals shown include unvalidated device data.  Last Pain:  Vitals:   08/22/20 0732  PainSc: 5          Complications: No complications documented.

## 2020-08-22 NOTE — H&P (Signed)
Gabriel Carroll is an 54 y.o. male.   Chief Complaint: Left foot pain  HPI: The patient is a 54 year old male who has a painful left forefoot after previous bunion correction and also has signs and symptoms of metatarsalgia with instability at the second MTP joint.  He has failed nonoperative treatment to date including activity modification, oral anti-inflammatories and shoewear modification.  He presents now for surgical correction of these painful left forefoot deformities.  Past Medical History:  Diagnosis Date  . Arthritis    OA left shoulder  . Atrial fibrillation ([place])   . Closed displaced fracture of base of fifth metacarpal bone of right hand with routine healing 08/20/2017  . Derangement of posterior horn of lateral meniscus 08/05/2018  . 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 Low testosterone   . Obesity (BMI 30-39.9)   . Pleural effusion   . Procedure and treatment not carried out because of patient's decision for other reasons 08/04/2012  . Sleep apnea    no CPAP    Past Surgical History:  Procedure Laterality Date  . BUNIONECTOMY    . CHOLECYSTECTOMY    . KNEE CARTILAGE SURGERY     lt  . liver doner    . lung surgey    . shoulder surgery rt      Family History  Problem Relation Age of Onset  . Hypertension Mother   . Arthritis Mother   . Hypertension Father   . Cancer Father   . Heart disease Father   . Diabetes Paternal Aunt   . Diabetes Paternal Uncle   . Diabetes Sister   . Diabetes Brother   . Asthma Maternal Grandmother   . Heart disease Maternal Grandfather   . Diabetes Paternal Grandmother   . Colon cancer Neg Hx   . Esophageal cancer Neg Hx   . Rectal cancer Neg Hx   . Stomach cancer Neg Hx    Social History:  reports that he has never smoked. He has never used smokeless tobacco. He reports current alcohol use of about 5.0 standard drinks of alcohol per week. He reports that he does not use drugs.  Allergies:  Allergies  Allergen Reactions   . Vancomycin Other (See Comments)    Red man syndrome    Medications Prior to Admission  Medication Sig Dispense Refill  . clomiPHENE (CLOMID) 50 MG tablet Take 25 mg by mouth daily.     Marland Kitchen ibuprofen (ADVIL,MOTRIN) 200 MG tablet Take 400 mg by mouth daily as needed.    Marland Kitchen acetaminophen (TYLENOL) 500 MG tablet Take 500 mg by mouth every 6 (six) hours as needed for mild pain, moderate pain or headache.     . metoprolol succinate (TOPROL-XL) 25 MG 24 hr tablet Take 1 tablet (25 mg total) by mouth daily. (Patient not taking: Reported on 04/26/2018) 30 tablet 12    No results found for this or any previous visit (from the past 48 hour(s)). No results found.  Review of Systems no recent fever, chills, nausea, vomiting or changes in his appetite  Blood pressure 127/79, pulse (!) 56, resp. rate 10, height 5\' 10"  (1.778 m), weight 105.8 kg, SpO2 97 %. Physical Exam  Well-nourished well-developed man in no apparent distress.  Alert and oriented x4.  Normal mood and affect.  Gait is normal.  Left foot has a moderate bunion deformity with a healed surgical incision dorsally.  Second toe is unstable at the MTP joint.  Skin is otherwise healthy and  intact.  Pulses are palpable.  No lymphadenopathy.  5 out of 5 strength in plantarflexion and dorsiflexion of the ankle and toes.   Assessment/Plan Left recurrent bunion deformity with painful hardware and second MTP joint instability and metatarsalgia -to the operating room today for removal of hardware and revision bunion correction as well as second MTP joint collateral ligament repair and Weil osteotomy.  The risks and benefits of the alternative treatment options have been discussed in detail.  The patient wishes to proceed with surgery and specifically understands risks of bleeding, infection, nerve damage, blood clots, need for additional surgery, amputation and death.   Wylene Simmer, MD 2020/09/05, 8:44 AM

## 2020-08-23 ENCOUNTER — Encounter (HOSPITAL_BASED_OUTPATIENT_CLINIC_OR_DEPARTMENT_OTHER): Payer: Self-pay | Admitting: Orthopedic Surgery

## 2020-08-29 DIAGNOSIS — M7541 Impingement syndrome of right shoulder: Secondary | ICD-10-CM | POA: Diagnosis not present

## 2020-08-29 DIAGNOSIS — M25611 Stiffness of right shoulder, not elsewhere classified: Secondary | ICD-10-CM | POA: Diagnosis not present

## 2020-08-29 DIAGNOSIS — M25511 Pain in right shoulder: Secondary | ICD-10-CM | POA: Diagnosis not present

## 2020-08-29 DIAGNOSIS — M6281 Muscle weakness (generalized): Secondary | ICD-10-CM | POA: Diagnosis not present

## 2020-09-04 DIAGNOSIS — M25611 Stiffness of right shoulder, not elsewhere classified: Secondary | ICD-10-CM | POA: Diagnosis not present

## 2020-09-04 DIAGNOSIS — M6281 Muscle weakness (generalized): Secondary | ICD-10-CM | POA: Diagnosis not present

## 2020-09-04 DIAGNOSIS — M25511 Pain in right shoulder: Secondary | ICD-10-CM | POA: Diagnosis not present

## 2020-09-04 DIAGNOSIS — M7541 Impingement syndrome of right shoulder: Secondary | ICD-10-CM | POA: Diagnosis not present

## 2020-09-11 ENCOUNTER — Encounter (HOSPITAL_BASED_OUTPATIENT_CLINIC_OR_DEPARTMENT_OTHER): Payer: BC Managed Care – PPO | Admitting: Cardiovascular Disease

## 2020-09-11 DIAGNOSIS — M7541 Impingement syndrome of right shoulder: Secondary | ICD-10-CM | POA: Diagnosis not present

## 2020-09-11 DIAGNOSIS — M6281 Muscle weakness (generalized): Secondary | ICD-10-CM | POA: Diagnosis not present

## 2020-09-11 DIAGNOSIS — M25611 Stiffness of right shoulder, not elsewhere classified: Secondary | ICD-10-CM | POA: Diagnosis not present

## 2020-10-09 DIAGNOSIS — M79672 Pain in left foot: Secondary | ICD-10-CM | POA: Diagnosis not present

## 2020-11-18 DIAGNOSIS — M79672 Pain in left foot: Secondary | ICD-10-CM | POA: Diagnosis not present

## 2021-01-14 DIAGNOSIS — R972 Elevated prostate specific antigen [PSA]: Secondary | ICD-10-CM | POA: Diagnosis not present

## 2021-01-14 DIAGNOSIS — N400 Enlarged prostate without lower urinary tract symptoms: Secondary | ICD-10-CM | POA: Diagnosis not present

## 2021-01-14 DIAGNOSIS — E291 Testicular hypofunction: Secondary | ICD-10-CM | POA: Diagnosis not present

## 2021-01-23 DIAGNOSIS — M9905 Segmental and somatic dysfunction of pelvic region: Secondary | ICD-10-CM | POA: Diagnosis not present

## 2021-01-23 DIAGNOSIS — M5386 Other specified dorsopathies, lumbar region: Secondary | ICD-10-CM | POA: Diagnosis not present

## 2021-01-23 DIAGNOSIS — M9903 Segmental and somatic dysfunction of lumbar region: Secondary | ICD-10-CM | POA: Diagnosis not present

## 2021-01-23 DIAGNOSIS — M5137 Other intervertebral disc degeneration, lumbosacral region: Secondary | ICD-10-CM | POA: Diagnosis not present

## 2021-01-27 DIAGNOSIS — M5137 Other intervertebral disc degeneration, lumbosacral region: Secondary | ICD-10-CM | POA: Diagnosis not present

## 2021-01-27 DIAGNOSIS — M9905 Segmental and somatic dysfunction of pelvic region: Secondary | ICD-10-CM | POA: Diagnosis not present

## 2021-01-27 DIAGNOSIS — M5386 Other specified dorsopathies, lumbar region: Secondary | ICD-10-CM | POA: Diagnosis not present

## 2021-01-27 DIAGNOSIS — M9903 Segmental and somatic dysfunction of lumbar region: Secondary | ICD-10-CM | POA: Diagnosis not present

## 2021-01-29 DIAGNOSIS — M9905 Segmental and somatic dysfunction of pelvic region: Secondary | ICD-10-CM | POA: Diagnosis not present

## 2021-01-29 DIAGNOSIS — M9903 Segmental and somatic dysfunction of lumbar region: Secondary | ICD-10-CM | POA: Diagnosis not present

## 2021-01-29 DIAGNOSIS — M5137 Other intervertebral disc degeneration, lumbosacral region: Secondary | ICD-10-CM | POA: Diagnosis not present

## 2021-01-29 DIAGNOSIS — M5386 Other specified dorsopathies, lumbar region: Secondary | ICD-10-CM | POA: Diagnosis not present

## 2021-02-03 DIAGNOSIS — M9903 Segmental and somatic dysfunction of lumbar region: Secondary | ICD-10-CM | POA: Diagnosis not present

## 2021-02-03 DIAGNOSIS — M9905 Segmental and somatic dysfunction of pelvic region: Secondary | ICD-10-CM | POA: Diagnosis not present

## 2021-02-03 DIAGNOSIS — M5386 Other specified dorsopathies, lumbar region: Secondary | ICD-10-CM | POA: Diagnosis not present

## 2021-02-03 DIAGNOSIS — M5137 Other intervertebral disc degeneration, lumbosacral region: Secondary | ICD-10-CM | POA: Diagnosis not present

## 2021-02-05 DIAGNOSIS — M5137 Other intervertebral disc degeneration, lumbosacral region: Secondary | ICD-10-CM | POA: Diagnosis not present

## 2021-02-05 DIAGNOSIS — M9905 Segmental and somatic dysfunction of pelvic region: Secondary | ICD-10-CM | POA: Diagnosis not present

## 2021-02-05 DIAGNOSIS — M5386 Other specified dorsopathies, lumbar region: Secondary | ICD-10-CM | POA: Diagnosis not present

## 2021-02-05 DIAGNOSIS — M9903 Segmental and somatic dysfunction of lumbar region: Secondary | ICD-10-CM | POA: Diagnosis not present

## 2021-05-12 ENCOUNTER — Encounter: Payer: Self-pay | Admitting: Gastroenterology

## 2021-07-23 DIAGNOSIS — D2262 Melanocytic nevi of left upper limb, including shoulder: Secondary | ICD-10-CM | POA: Diagnosis not present

## 2021-07-23 DIAGNOSIS — L738 Other specified follicular disorders: Secondary | ICD-10-CM | POA: Diagnosis not present

## 2021-07-23 DIAGNOSIS — D224 Melanocytic nevi of scalp and neck: Secondary | ICD-10-CM | POA: Diagnosis not present

## 2021-07-23 DIAGNOSIS — D225 Melanocytic nevi of trunk: Secondary | ICD-10-CM | POA: Diagnosis not present

## 2021-09-09 DIAGNOSIS — R0781 Pleurodynia: Secondary | ICD-10-CM | POA: Diagnosis not present

## 2021-09-23 DIAGNOSIS — M25562 Pain in left knee: Secondary | ICD-10-CM | POA: Diagnosis not present

## 2021-09-26 DIAGNOSIS — M25562 Pain in left knee: Secondary | ICD-10-CM | POA: Diagnosis not present

## 2021-10-10 DIAGNOSIS — M948X6 Other specified disorders of cartilage, lower leg: Secondary | ICD-10-CM | POA: Diagnosis not present

## 2021-10-10 DIAGNOSIS — S83232A Complex tear of medial meniscus, current injury, left knee, initial encounter: Secondary | ICD-10-CM | POA: Diagnosis not present

## 2021-10-10 DIAGNOSIS — X58XXXA Exposure to other specified factors, initial encounter: Secondary | ICD-10-CM | POA: Diagnosis not present

## 2021-10-10 DIAGNOSIS — Y999 Unspecified external cause status: Secondary | ICD-10-CM | POA: Diagnosis not present

## 2021-10-10 DIAGNOSIS — S83242A Other tear of medial meniscus, current injury, left knee, initial encounter: Secondary | ICD-10-CM | POA: Diagnosis not present

## 2021-10-12 HISTORY — PX: OTHER SURGICAL HISTORY: SHX169

## 2021-10-14 DIAGNOSIS — N400 Enlarged prostate without lower urinary tract symptoms: Secondary | ICD-10-CM | POA: Diagnosis not present

## 2021-10-14 DIAGNOSIS — E291 Testicular hypofunction: Secondary | ICD-10-CM | POA: Diagnosis not present

## 2021-10-14 DIAGNOSIS — R972 Elevated prostate specific antigen [PSA]: Secondary | ICD-10-CM | POA: Diagnosis not present

## 2021-10-15 DIAGNOSIS — S83242D Other tear of medial meniscus, current injury, left knee, subsequent encounter: Secondary | ICD-10-CM | POA: Diagnosis not present

## 2021-10-15 DIAGNOSIS — M6281 Muscle weakness (generalized): Secondary | ICD-10-CM | POA: Diagnosis not present

## 2021-10-15 DIAGNOSIS — M25562 Pain in left knee: Secondary | ICD-10-CM | POA: Diagnosis not present

## 2021-10-15 DIAGNOSIS — M25662 Stiffness of left knee, not elsewhere classified: Secondary | ICD-10-CM | POA: Diagnosis not present

## 2021-10-22 DIAGNOSIS — M25662 Stiffness of left knee, not elsewhere classified: Secondary | ICD-10-CM | POA: Diagnosis not present

## 2021-10-22 DIAGNOSIS — M25562 Pain in left knee: Secondary | ICD-10-CM | POA: Diagnosis not present

## 2021-10-22 DIAGNOSIS — S83242D Other tear of medial meniscus, current injury, left knee, subsequent encounter: Secondary | ICD-10-CM | POA: Diagnosis not present

## 2021-10-22 DIAGNOSIS — M6281 Muscle weakness (generalized): Secondary | ICD-10-CM | POA: Diagnosis not present

## 2021-10-29 DIAGNOSIS — M6281 Muscle weakness (generalized): Secondary | ICD-10-CM | POA: Diagnosis not present

## 2021-10-29 DIAGNOSIS — S83242D Other tear of medial meniscus, current injury, left knee, subsequent encounter: Secondary | ICD-10-CM | POA: Diagnosis not present

## 2021-10-29 DIAGNOSIS — M25562 Pain in left knee: Secondary | ICD-10-CM | POA: Diagnosis not present

## 2021-10-29 DIAGNOSIS — M25662 Stiffness of left knee, not elsewhere classified: Secondary | ICD-10-CM | POA: Diagnosis not present

## 2021-11-24 DIAGNOSIS — M533 Sacrococcygeal disorders, not elsewhere classified: Secondary | ICD-10-CM | POA: Diagnosis not present

## 2022-02-11 ENCOUNTER — Encounter: Payer: Self-pay | Admitting: Primary Care

## 2022-02-11 ENCOUNTER — Ambulatory Visit (INDEPENDENT_AMBULATORY_CARE_PROVIDER_SITE_OTHER): Payer: BC Managed Care – PPO | Admitting: Primary Care

## 2022-02-11 VITALS — BP 118/74 | HR 62 | Ht 69.5 in | Wt 220.0 lb

## 2022-02-11 DIAGNOSIS — I480 Paroxysmal atrial fibrillation: Secondary | ICD-10-CM | POA: Diagnosis not present

## 2022-02-11 DIAGNOSIS — R0609 Other forms of dyspnea: Secondary | ICD-10-CM | POA: Insufficient documentation

## 2022-02-11 DIAGNOSIS — M25562 Pain in left knee: Secondary | ICD-10-CM

## 2022-02-11 DIAGNOSIS — Z23 Encounter for immunization: Secondary | ICD-10-CM | POA: Diagnosis not present

## 2022-02-11 DIAGNOSIS — Z0000 Encounter for general adult medical examination without abnormal findings: Secondary | ICD-10-CM

## 2022-02-11 DIAGNOSIS — R7989 Other specified abnormal findings of blood chemistry: Secondary | ICD-10-CM

## 2022-02-11 DIAGNOSIS — Z1211 Encounter for screening for malignant neoplasm of colon: Secondary | ICD-10-CM | POA: Diagnosis not present

## 2022-02-11 DIAGNOSIS — G8929 Other chronic pain: Secondary | ICD-10-CM

## 2022-02-11 DIAGNOSIS — G4730 Sleep apnea, unspecified: Secondary | ICD-10-CM

## 2022-02-11 LAB — LIPID PANEL
Cholesterol: 176 mg/dL (ref 0–200)
HDL: 46.3 mg/dL (ref >39.00)
LDL Cholesterol: 115 mg/dL — ABNORMAL HIGH (ref 0–99)
NonHDL: 129.66
Total CHOL/HDL Ratio: 4
Triglycerides: 73 mg/dL (ref 0.0–149.0)
VLDL: 14.6 mg/dL (ref 0.0–40.0)

## 2022-02-11 LAB — COMPREHENSIVE METABOLIC PANEL
ALT: 22 U/L (ref 0–53)
AST: 24 U/L (ref 0–37)
Albumin: 4.6 g/dL (ref 3.5–5.2)
Alkaline Phosphatase: 69 U/L (ref 39–117)
BUN: 16 mg/dL (ref 6–23)
CO2: 29 mEq/L (ref 19–32)
Calcium: 9.7 mg/dL (ref 8.4–10.5)
Chloride: 104 mEq/L (ref 96–112)
Creatinine, Ser: 1.09 mg/dL (ref 0.40–1.50)
GFR: 75.44 mL/min (ref >60.00)
Glucose, Bld: 93 mg/dL (ref 70–99)
Potassium: 4.2 mEq/L (ref 3.5–5.1)
Sodium: 138 mEq/L (ref 135–145)
Total Bilirubin: 0.7 mg/dL (ref 0.2–1.2)
Total Protein: 7.5 g/dL (ref 6.0–8.3)

## 2022-02-11 LAB — CBC
HCT: 48.3 % (ref 39.0–52.0)
Hemoglobin: 16.3 g/dL (ref 13.0–17.0)
MCHC: 33.7 g/dL (ref 30.0–36.0)
MCV: 94.6 fl (ref 78.0–100.0)
Platelets: 118 10*3/uL — ABNORMAL LOW (ref 150.0–400.0)
RBC: 5.1 Mil/uL (ref 4.22–5.81)
RDW: 12.6 % (ref 11.5–15.5)
WBC: 5.3 10*3/uL (ref 4.0–10.5)

## 2022-02-11 LAB — HEMOGLOBIN A1C: Hgb A1c MFr Bld: 4.9 % (ref 4.6–6.5)

## 2022-02-11 NOTE — Patient Instructions (Addendum)
Stop by the lab prior to leaving today. I will notify you of your results once received.  ? ?You will be contacted regarding your referral to GI for the colonoscopy.  Please let us know if you have not been contacted within two weeks.  ? ?Follow up with cardiology. ? ?It was a pleasure to see you today! ? ?Preventive Care 57-57 Years Old, Male ?Preventive care refers to lifestyle choices and visits with your health care provider that can promote health and wellness. Preventive care visits are also called wellness exams. ?What can I expect for my preventive care visit? ?Counseling ?During your preventive care visit, your health care provider may ask about your: ?Medical history, including: ?Past medical problems. ?Family medical history. ?Current health, including: ?Emotional well-being. ?Home life and relationship well-being. ?Sexual activity. ?Lifestyle, including: ?Alcohol, nicotine or tobacco, and drug use. ?Access to firearms. ?Diet, exercise, and sleep habits. ?Safety issues such as seatbelt and bike helmet use. ?Sunscreen use. ?Work and work Statistician. ?Physical exam ?Your health care provider will check your: ?Height and weight. These may be used to calculate your BMI (body mass index). BMI is a measurement that tells if you are at a healthy weight. ?Waist circumference. This measures the distance around your waistline. This measurement also tells if you are at a healthy weight and may help predict your risk of certain diseases, such as type 2 diabetes and high blood pressure. ?Heart rate and blood pressure. ?Body temperature. ?Skin for abnormal spots. ?What immunizations do I need? ? ?Vaccines are usually given at various ages, according to a schedule. Your health care provider will recommend vaccines for you based on your age, medical history, and lifestyle or other factors, such as travel or where you work. ?What tests do I need? ?Screening ?Your health care provider may recommend screening tests for  certain conditions. This may include: ?Lipid and cholesterol levels. ?Diabetes screening. This is done by checking your blood sugar (glucose) after you have not eaten for a while (fasting). ?Hepatitis B test. ?Hepatitis C test. ?HIV (human immunodeficiency virus) test. ?STI (sexually transmitted infection) testing, if you are at risk. ?Lung cancer screening. ?Prostate cancer screening. ?Colorectal cancer screening. ?Talk with your health care provider about your test results, treatment options, and if necessary, the need for more tests. ?Follow these instructions at home: ?Eating and drinking ? ?Eat a diet that includes fresh fruits and vegetables, whole grains, lean protein, and low-fat dairy products. ?Take vitamin and mineral supplements as recommended by your health care provider. ?Do not drink alcohol if your health care provider tells you not to drink. ?If you drink alcohol: ?Limit how much you have to 0-2 drinks a day. ?Know how much alcohol is in your drink. In the [place] one drink equals one 12 oz bottle of beer (355 mL), one 5 oz glass of wine (148 mL), or one 1? oz glass of hard liquor (44 mL). ?Lifestyle ?Brush your teeth every morning and night with fluoride toothpaste. Floss one time each day. ?Exercise for at least 30 minutes 5 or more days each week. ?Do not use any products that contain nicotine or tobacco. These products include cigarettes, chewing tobacco, and vaping devices, such as e-cigarettes. If you need help quitting, ask your health care provider. ?Do not use drugs. ?If you are sexually active, practice safe sex. Use a condom or other form of protection to prevent STIs. ?Take aspirin only as told by your health care provider. Make sure that you understand how much  to take and what form to take. Work with your health care provider to find out whether it is safe and beneficial for you to take aspirin daily. ?Find healthy ways to manage stress, such as: ?Meditation, yoga, or listening to  music. ?Journaling. ?Talking to a trusted person. ?Spending time with friends and family. ?Minimize exposure to UV radiation to reduce your risk of skin cancer. ?Safety ?Always wear your seat belt while driving or riding in a vehicle. ?Do not drive: ?If you have been drinking alcohol. Do not ride with someone who has been drinking. ?When you are tired or distracted. ?While texting. ?If you have been using any mind-altering substances or drugs. ?Wear a helmet and other protective equipment during sports activities. ?If you have firearms in your house, make sure you follow all gun safety procedures. ?What's next? ?Go to your health care provider once a year for an annual wellness visit. ?Ask your health care provider how often you should have your eyes and teeth checked. ?Stay up to date on all vaccines. ?This information is not intended to replace advice given to you by your health care provider. Make sure you discuss any questions you have with your health care provider. ?Document Revised: 03/26/2021 Document Reviewed: 03/26/2021 ?Elsevier Patient Education ? [place]. ? ?

## 2022-02-11 NOTE — Progress Notes (Signed)
? ?Subjective:  ? ? Patient ID: Gabriel Carroll, male    DOB: Dec 05, 1965, 56 y.o.   MRN: [id number] ? ?HPI ? ?Beatriz Settles is a very pleasant 56 y.o. male who presents today for complete physical and follow up of chronic conditions. ? ?He has not been seen in person since July 2019. ? ?Immunizations: ?-Tetanus: 2018 ?-Influenza: Did not complete last season  ?-Covid-19: 2 vaccines ?-Shingles: Never completed ? ?Diet: Fair diet.  ?Exercise: Exercising regularly  ? ?Eye exam: Completes annually  ?Dental exam: Completes semi-annually  ? ?Colonoscopy: Completed in 2017, due 2022 ?PSA: Due ? ?BP Readings from Last 3 Encounters:  ?02/11/22 118/74  ?08/22/20 130/82  ?08/05/20 140/76  ? ? ? ? ? ?Review of Systems  ?Constitutional:  Negative for unexpected weight change.  ?HENT:  Negative for rhinorrhea.   ?Respiratory:  Negative for cough and shortness of breath.   ?Cardiovascular:  Negative for chest pain.  ?Gastrointestinal:  Negative for constipation and diarrhea.  ?Genitourinary:  Negative for difficulty urinating.  ?Musculoskeletal:  Negative for arthralgias and myalgias.  ?Skin:  Negative for rash.  ?Allergic/Immunologic: Negative for environmental allergies.  ?Neurological:  Negative for dizziness and headaches.  ?Psychiatric/Behavioral:  The patient is not nervous/anxious.   ? ?   ? ? ?Past Medical History:  ?Diagnosis Date  ? Arthritis   ? OA left shoulder  ? Atrial fibrillation ([place])   ? Closed displaced fracture of base of fifth metacarpal bone of right hand with routine healing 08/20/2017  ? Derangement of posterior horn of lateral meniscus 08/05/2018  ? 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 Low testosterone   ? Obesity (BMI 30-39.9)   ? Pleural effusion   ? Procedure and treatment not carried out because of patient's decision for other reasons 08/04/2012  ? Sleep apnea   ? no CPAP  ? ? ?Social History  ? ?Socioeconomic History  ? Marital status: Married  ?  Spouse name: Not on file  ? Number of children: Not  on file  ? Years of education: Not on file  ? Highest education level: Not on file  ?Occupational History  ? Not on file  ?Tobacco Use  ? Smoking status: Never  ? Smokeless tobacco: Never  ?Vaping Use  ? Vaping Use: Never used  ?Substance and Sexual Activity  ? Alcohol use: Yes  ?  Alcohol/week: 5.0 standard drinks  ?  Types: 5 Cans of beer per week  ?  Comment: occ  ? Drug use: No  ? Sexual activity: Not on file  ?Other Topics Concern  ? Not on file  ?Social History Narrative  ? Married.  ? 2 children.  ? Works as a Civil engineer, contracting  ? Highest level of Education BS.  ? Enjoys mountain biking, brewing beers, hiking.   ? ?Social Determinants of Health  ? ?Financial Resource Strain: Not on file  ?Food Insecurity: Not on file  ?Transportation Needs: Not on file  ?Physical Activity: Not on file  ?Stress: Not on file  ?Social Connections: Not on file  ?Intimate Partner Violence: Not on file  ? ? ?Past Surgical History:  ?Procedure Laterality Date  ? BUNIONECTOMY    ? CHOLECYSTECTOMY    ? foot sugery  08/2020  ? HARDWARE REMOVAL Left 08/22/2020  ? Procedure: Left foot 1st metatarsal removal of deep implant;  Surgeon: Wylene Simmer, MD;  Location: [facility];  Service: Orthopedics;  Laterality: Left;  31mn  ? KNEE CARTILAGE SURGERY    ?  lt  ? LIGAMENT REPAIR  08/22/2020  ? Procedure: LIGAMENT REPAIR;  Surgeon: Wylene Simmer, MD;  Location: [place];  Service: Orthopedics;;  ? liver doner    ? lung surgey    ? METATARSAL OSTEOTOMY  08/22/2020  ? Procedure: METATARSAL OSTEOTOMY;  Surgeon: Wylene Simmer, MD;  Location: [facility];  Service: Orthopedics;;  ? shoulder surgery rt    ? WEIL OSTEOTOMY Left 08/22/2020  ? Procedure: 1st metatarsal Scarf osteotomy; 2nd metatarsal Weil osteotomy; repair of lateral collateral ligament;  Surgeon: Wylene Simmer, MD;  Location: [facility];  Service: Orthopedics;  Laterality: Left;  ? ? ?Family History  ?Problem Relation Age of Onset   ? Hypertension Mother   ? Arthritis Mother   ? Hypertension Father   ? Cancer Father   ? Heart disease Father   ? Diabetes Paternal Aunt   ? Diabetes Paternal Uncle   ? Diabetes Sister   ? Diabetes Brother   ? Asthma Maternal Grandmother   ? Heart disease Maternal Grandfather   ? Diabetes Paternal Grandmother   ? Colon cancer Neg Hx   ? Esophageal cancer Neg Hx   ? Rectal cancer Neg Hx   ? Stomach cancer Neg Hx   ? ? ?Allergies  ?Allergen Reactions  ? Vancomycin Other (See Comments)  ?  Red man syndrome  ? ? ?Current Outpatient Medications on File Prior to Visit  ?Medication Sig Dispense Refill  ? acetaminophen (TYLENOL) 500 MG tablet Take 500 mg by mouth every 6 (six) hours as needed for mild pain, moderate pain or headache.     ? clomiPHENE (CLOMID) 50 MG tablet Take 25 mg by mouth daily.     ? ibuprofen (ADVIL,MOTRIN) 200 MG tablet Take 400 mg by mouth daily as needed.    ? ?No current facility-administered medications on file prior to visit.  ? ? ?BP 118/74   Pulse 62   Ht 5' 9.5" (1.765 m)   Wt 220 lb (99.8 kg)   SpO2 97%   BMI 32.02 kg/m?  ?Objective:  ? Physical Exam ?HENT:  ?   Right Ear: Tympanic membrane and ear canal normal.  ?   Left Ear: Tympanic membrane and ear canal normal.  ?   Nose: Nose normal.  ?   Right Sinus: No maxillary sinus tenderness or frontal sinus tenderness.  ?   Left Sinus: No maxillary sinus tenderness or frontal sinus tenderness.  ?Eyes:  ?   Conjunctiva/sclera: Conjunctivae normal.  ?Neck:  ?   Thyroid: No thyromegaly.  ?   Vascular: No carotid bruit.  ?Cardiovascular:  ?   Rate and Rhythm: Normal rate and regular rhythm.  ?   Heart sounds: Normal heart sounds.  ?Pulmonary:  ?   Effort: Pulmonary effort is normal.  ?   Breath sounds: Normal breath sounds. No wheezing or rales.  ?   Comments: Infrequent dyspnea with walking steep inclines, history of pleural effusion 20 years ago. ?Abdominal:  ?   General: Bowel sounds are normal.  ?   Palpations: Abdomen is soft.  ?    Tenderness: There is no abdominal tenderness.  ?Musculoskeletal:     ?   General: Normal range of motion.  ?   Cervical back: Neck supple.  ?Skin: ?   General: Skin is warm and dry.  ?Neurological:  ?   Mental Status: He is alert and oriented to person, place, and time.  ?   Cranial Nerves: No cranial  nerve deficit.  ?   Deep Tendon Reflexes: Reflexes are normal and symmetric.  ?Psychiatric:     ?   Mood and Affect: Mood normal.  ? ? ? ? ? ?   ?Assessment & Plan:  ? ? ? ? ?This visit occurred during the SARS-CoV-2 public health emergency.  Safety protocols were in place, including screening questions prior to the visit, additional usage of staff PPE, and extensive cleaning of exam room while observing appropriate contact time as indicated for disinfecting solutions.  ?

## 2022-02-11 NOTE — Assessment & Plan Note (Signed)
S/p left knee meniscal tear repair. ? ?Continue Meloxicam 15 mg PRN. ?

## 2022-02-11 NOTE — Addendum Note (Signed)
Addended by: Tyrone Apple on: 02/11/2022 10:00 AM ? ? Modules accepted: Orders ? ?

## 2022-02-11 NOTE — Assessment & Plan Note (Signed)
Shingrix due, first vaccine provided today. Tetanus UTD. ?PSA UTD, follows with Urology. ?Colonoscopy overdue, referral placed to GI. ? ?Commended him on regular exercise and a healthy diet. ? ?Exam today stable. ?Labs pending. ?

## 2022-02-11 NOTE — Assessment & Plan Note (Signed)
Not using CPAP machine. ? ?Weight loss of 25 pounds, commended him on this success! ? ?He declines further treatment at this time. ?

## 2022-02-11 NOTE — Assessment & Plan Note (Addendum)
No recent follow up with cardiology.  ?Rate and rhythm regular today. ? ?No longer on metoprolol and flecainide.  ?He plans on rescheduling follow up soon.  ?

## 2022-02-11 NOTE — Assessment & Plan Note (Addendum)
Entirely with moderate exertion. ? ?Exam today benign. No alarm signs. ?Recommended cardiology follow up, he agrees and is planning to schedule.  ?

## 2022-02-11 NOTE — Assessment & Plan Note (Signed)
Following with Urology annually. ? ?Continue Clomid 50 mg daily. ?PSA is UTD. ?

## 2022-02-16 DIAGNOSIS — D696 Thrombocytopenia, unspecified: Secondary | ICD-10-CM

## 2022-02-19 ENCOUNTER — Telehealth: Payer: Self-pay | Admitting: Hematology and Oncology

## 2022-02-19 NOTE — Telephone Encounter (Signed)
Scheduled appt per 5/10 referral. Pt is aware of appt date and time. Pt is aware to arrive 15 mins prior to appt time and to bring and updated insurance card. Pt is aware of appt location.   ?

## 2022-03-05 ENCOUNTER — Inpatient Hospital Stay: Payer: BC Managed Care – PPO | Attending: Hematology and Oncology | Admitting: Hematology and Oncology

## 2022-03-05 ENCOUNTER — Other Ambulatory Visit: Payer: Self-pay

## 2022-03-05 ENCOUNTER — Inpatient Hospital Stay: Payer: BC Managed Care – PPO

## 2022-03-05 VITALS — BP 148/89 | HR 65 | Temp 97.6°F | Resp 15 | Wt 221.9 lb

## 2022-03-05 DIAGNOSIS — R634 Abnormal weight loss: Secondary | ICD-10-CM | POA: Diagnosis not present

## 2022-03-05 DIAGNOSIS — Z9049 Acquired absence of other specified parts of digestive tract: Secondary | ICD-10-CM | POA: Diagnosis not present

## 2022-03-05 DIAGNOSIS — Z8616 Personal history of COVID-19: Secondary | ICD-10-CM | POA: Insufficient documentation

## 2022-03-05 DIAGNOSIS — Z833 Family history of diabetes mellitus: Secondary | ICD-10-CM | POA: Diagnosis not present

## 2022-03-05 DIAGNOSIS — Z79899 Other long term (current) drug therapy: Secondary | ICD-10-CM | POA: Diagnosis not present

## 2022-03-05 DIAGNOSIS — Z8261 Family history of arthritis: Secondary | ICD-10-CM | POA: Diagnosis not present

## 2022-03-05 DIAGNOSIS — G4730 Sleep apnea, unspecified: Secondary | ICD-10-CM | POA: Diagnosis not present

## 2022-03-05 DIAGNOSIS — Z881 Allergy status to other antibiotic agents status: Secondary | ICD-10-CM | POA: Insufficient documentation

## 2022-03-05 DIAGNOSIS — Z836 Family history of other diseases of the respiratory system: Secondary | ICD-10-CM | POA: Diagnosis not present

## 2022-03-05 DIAGNOSIS — D696 Thrombocytopenia, unspecified: Secondary | ICD-10-CM | POA: Diagnosis not present

## 2022-03-05 DIAGNOSIS — I4891 Unspecified atrial fibrillation: Secondary | ICD-10-CM | POA: Diagnosis not present

## 2022-03-05 DIAGNOSIS — Z809 Family history of malignant neoplasm, unspecified: Secondary | ICD-10-CM | POA: Insufficient documentation

## 2022-03-05 DIAGNOSIS — R5383 Other fatigue: Secondary | ICD-10-CM | POA: Insufficient documentation

## 2022-03-05 DIAGNOSIS — Z8249 Family history of ischemic heart disease and other diseases of the circulatory system: Secondary | ICD-10-CM | POA: Diagnosis not present

## 2022-03-05 LAB — FOLATE: Folate: 12.6 ng/mL (ref >5.9)

## 2022-03-05 LAB — CBC WITH DIFFERENTIAL (CANCER CENTER ONLY)
Abs Immature Granulocytes: 0.01 10*3/uL (ref 0.00–0.07)
Basophils Absolute: 0 10*3/uL (ref 0.0–0.1)
Basophils Relative: 1 %
Eosinophils Absolute: 0.1 10*3/uL (ref 0.0–0.5)
Eosinophils Relative: 2 %
HCT: 46.3 % (ref 39.0–52.0)
Hemoglobin: 16.4 g/dL (ref 13.0–17.0)
Immature Granulocytes: 0 %
Lymphocytes Relative: 30 %
Lymphs Abs: 1.6 10*3/uL (ref 0.7–4.0)
MCH: 31.8 pg (ref 26.0–34.0)
MCHC: 35.4 g/dL (ref 30.0–36.0)
MCV: 89.9 fL (ref 80.0–100.0)
Monocytes Absolute: 0.5 10*3/uL (ref 0.1–1.0)
Monocytes Relative: 9 %
Neutro Abs: 3.1 10*3/uL (ref 1.7–7.7)
Neutrophils Relative %: 58 %
Platelet Count: 132 10*3/uL — ABNORMAL LOW (ref 150–400)
RBC: 5.15 MIL/uL (ref 4.22–5.81)
RDW: 11.6 % (ref 11.5–15.5)
WBC Count: 5.3 10*3/uL (ref 4.0–10.5)
nRBC: 0 % (ref 0.0–0.2)

## 2022-03-05 LAB — CMP (CANCER CENTER ONLY)
ALT: 19 U/L (ref 0–44)
AST: 21 U/L (ref 15–41)
Albumin: 4.5 g/dL (ref 3.5–5.0)
Alkaline Phosphatase: 71 U/L (ref 38–126)
Anion gap: 5 (ref 5–15)
BUN: 13 mg/dL (ref 6–20)
CO2: 28 mmol/L (ref 22–32)
Calcium: 9.7 mg/dL (ref 8.9–10.3)
Chloride: 106 mmol/L (ref 98–111)
Creatinine: 0.96 mg/dL (ref 0.61–1.24)
GFR, Estimated: >60 mL/min (ref >60)
Glucose, Bld: 102 mg/dL — ABNORMAL HIGH (ref 70–99)
Potassium: 3.9 mmol/L (ref 3.5–5.1)
Sodium: 139 mmol/L (ref 135–145)
Total Bilirubin: 0.7 mg/dL (ref 0.3–1.2)
Total Protein: 7.4 g/dL (ref 6.5–8.1)

## 2022-03-05 LAB — IMMATURE PLATELET FRACTION: Immature Platelet Fraction: 4.6 % (ref 1.2–8.6)

## 2022-03-05 LAB — SAVE SMEAR(SSMR), FOR PROVIDER SLIDE REVIEW

## 2022-03-05 LAB — VITAMIN B12: Vitamin B-12: 283 pg/mL (ref 180–914)

## 2022-03-09 NOTE — Progress Notes (Signed)
[facility] [phone]   [phone]  INITIAL CONSULT NOTE  Patient Care Team: Doreene Nest, NP as PCP - General (Nurse Practitioner)  Hematological/Oncological History #Thrombocytopenia 05/19/2015: WBC 7.6, Hgb 16.6, MCV 89.4, Plt 139 09/26/2017: WBC 9.7, Hgb 15.9, MCV 90.8, Plt 133 02/11/2022: WBC 5.3, Hgb 16.3, MCV 94.6, Plt 118 03/05/2022: establish care with Dr. Leonides Schanz   CHIEF COMPLAINTS/PURPOSE OF CONSULTATION:  "Thrombocytopenia"  HISTORY OF PRESENTING ILLNESS:  Gabriel Carroll 56 y.o. male with medical history significant for atrial fibrillation, low testosterone, and partial liver removal for transplant who presents for evaluation of thrombocytopenia.  On review of the previous records [name] has longstanding thrombocytopenia dating back to at least 05/19/2015.  He has consistently had a platelet count of approximately 130.  On 02/11/2022 the patient had a platelet count to drop down to 118.  Due to concern for this change in his platelet count he was referred to hematology for further evaluation and management.  On exam today [name] reports that he has been well with no recent changes in his health.  He reports he is not having any episodes of bleeding or dark stools.  He notes he has felt somewhat fatigued lately.  He does ride on the [place] on a daily basis.  He reports he does feel quite tired in the morning.  He is a Sport and exercise psychologist by trade.  He notes that recently he has been making some changes to his diet and recently prescribed to [place] which assist with weight loss.  He reports he lost about 20 pounds intentionally on this program.  He notes that he watches his portion and how often he eats.  He reports that in 2001 he underwent a partial liver resection in order to help provide a liver transplant to his brother.  Unfortunately was complicated by infection and pleural infusion.  On further discussion he notes that his father passed away  from "cancer in his bones".  He reports that his brother had complications of hepatitis requiring the transplant.  He has 2 healthy children.  He is a never smoker and tends to drink about 5-6 beers on the weekend.  He reports that he is a little bit heavier drinker when he was younger.  He otherwise denies any fevers, chills, sweats, nausea, ming or diarrhea.  Full 10 point ROS is listed below.  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   Low testosterone    Obesity (BMI 30-39.9)    Pleural effusion    Procedure and treatment not carried out because of patient's decision for other reasons 08/04/2012   Sleep apnea    no CPAP    SURGICAL HISTORY: Past Surgical History:  Procedure Laterality Date   BUNIONECTOMY     CHOLECYSTECTOMY     foot sugery  08/2020   HARDWARE REMOVAL Left 08/22/2020   Procedure: Left foot 1st metatarsal removal of deep implant;  Surgeon: Toni Arthurs, MD;  Location: [facility];  Service: Orthopedics;  Laterality: Left;    KNEE CARTILAGE SURGERY     lt   LIGAMENT REPAIR  08/22/2020  Procedure: LIGAMENT REPAIR;  Surgeon: Wylene Simmer, MD;  Location: [place];  Service: Orthopedics;;   liver doner     lung surgey     METATARSAL OSTEOTOMY  08/22/2020   Procedure: METATARSAL OSTEOTOMY;  Surgeon: Wylene Simmer, MD;  Location: [place];  Service: Orthopedics;;   shoulder surgery rt     WEIL OSTEOTOMY Left 08/22/2020   Procedure: 1st metatarsal Scarf osteotomy; 2nd metatarsal Weil osteotomy; repair of lateral collateral ligament;  Surgeon: Wylene Simmer, MD;  Location: [place];  Service: Orthopedics;  Laterality: Left;    SOCIAL HISTORY: Social History   Socioeconomic History   Marital status: Married    Spouse name: Not on file   Number of children: Not on file   Years of education: Not on file   Highest education level: Not on file  Occupational History   Not on file  Tobacco Use   Smoking status: Never   Smokeless tobacco: Never  Vaping Use   Vaping Use: Never used  Substance and Sexual Activity   Alcohol use: Yes    Alcohol/week: 5.0 standard drinks    Types: 5 Cans of beer per week    Comment: occ   Drug use: No   Sexual activity: Not on file  Other Topics Concern   Not on file  Social History Narrative   Married.   2 children.   Works as a Scientist, forensic.   Enjoys mountain biking, brewing beers, hiking.    Social Determinants of Health   Financial Resource Strain: Not on file  Food Insecurity: Not on file  Transportation Needs: Not on file  Physical Activity: Not on file  Stress: Not on file  Social Connections: Not on file  Intimate Partner Violence: Not on file    FAMILY HISTORY: Family History  Problem Relation Age of Onset   Hypertension Mother    Arthritis Mother    Hypertension Father    Cancer Father    Heart disease Father    Diabetes Paternal Aunt    Diabetes Paternal Uncle    Diabetes Sister    Diabetes Brother    Asthma Maternal Grandmother    Heart disease Maternal Grandfather    Diabetes Paternal Grandmother    Colon cancer Neg Hx    Esophageal cancer Neg Hx    Rectal cancer Neg Hx    Stomach cancer Neg Hx     ALLERGIES:  is allergic to vancomycin.  MEDICATIONS:  Current Outpatient Medications  Medication Sig Dispense Refill   tadalafil (CIALIS) 5 MG tablet      acetaminophen (TYLENOL) 500 MG tablet Take 500 mg by mouth every 6 (six) hours as needed for mild pain, moderate pain or headache.      clomiPHENE (CLOMID) 50 MG tablet Take 50 mg by mouth daily.      ibuprofen (ADVIL,MOTRIN) 200 MG tablet Take 400 mg by mouth daily as needed.     meloxicam (MOBIC) 15 MG tablet Take 15 mg by mouth daily.     No current facility-administered medications for this visit.    REVIEW OF SYSTEMS:   Constitutional: ( - ) fevers, ( - )  chills , ( - ) night sweats Eyes: ( - ) blurriness of vision, ( - ) double vision, ( - ) watery eyes Ears, nose, mouth, throat, and face: ( - ) mucositis, ( - ) sore throat Respiratory: ( - ) cough, ( - )  dyspnea, ( - ) wheezes Cardiovascular: ( - ) palpitation, ( - ) chest discomfort, ( - ) lower extremity swelling Gastrointestinal:  ( - ) nausea, ( - ) heartburn, ( - ) change in bowel habits Skin: ( - ) abnormal skin rashes Lymphatics: ( - ) new lymphadenopathy, ( - ) easy bruising Neurological: ( - ) numbness, ( - ) tingling, ( - ) new weaknesses Behavioral/Psych: ( - ) mood change, ( - ) new changes  All other systems were reviewed with the patient and are negative.  PHYSICAL EXAMINATION:  Vitals:   03/05/22 0911  BP: (!) 148/89  Pulse: 65  Resp: 15  Temp: 97.6 F (36.4 C)  SpO2: 97%   Filed Weights   03/05/22 0911  Weight: 221 lb 14.4 oz (100.7 kg)    GENERAL: well appearing middle-aged Caucasian male in NAD  SKIN: skin color, texture, turgor are normal, no rashes or significant lesions EYES: conjunctiva are pink and non-injected, sclera clear LUNGS: clear to auscultation and percussion with normal breathing effort HEART: regular rate & rhythm and no murmurs and no lower extremity edema Musculoskeletal: no cyanosis of digits and no clubbing  PSYCH: alert & oriented x 3, fluent speech NEURO: no focal motor/sensory deficits  LABORATORY DATA:  I have reviewed the data as listed    Latest Ref Rng & Units 03/05/2022    9:49 AM 02/11/2022   10:05 AM 09/26/2017   12:28 AM  CBC  WBC 4.0 - 10.5 K/uL 5.3   5.3   9.7    Hemoglobin 13.0 - 17.0 g/dL 16.4   16.3   15.9    Hematocrit 39.0 - 52.0 % 46.3   48.3   44.6     Platelets 150 - 400 K/uL 132   118.0   133         Latest Ref Rng & Units 03/05/2022    9:49 AM 02/11/2022   10:05 AM 04/20/2018    9:23 AM  CMP  Glucose 70 - 99 mg/dL 102   93   101    BUN 6 - 20 mg/dL $Remove'13   16   20    'spGqYmx$ Creatinine 0.61 - 1.24 mg/dL 0.96   1.09   0.90    Sodium 135 - 145 mmol/L 139   138   140    Potassium 3.5 - 5.1 mmol/L 3.9   4.2   4.6    Chloride 98 - 111 mmol/L 106   104   103    CO2 22 - 32 mmol/L 28   29   32    Calcium 8.9 - 10.3 mg/dL 9.7   9.7   9.7    Total Protein 6.5 - 8.1 g/dL 7.4   7.5   7.1    Total Bilirubin 0.3 - 1.2 mg/dL 0.7   0.7   0.6    Alkaline Phos 38 - 126 U/L 71   69   76    AST 15 - 41 U/L $Remo'21   24   25    'ZYZan$ ALT 0 - 44 U/L $Remo'19   22   28       'fXjxO$ ASSESSMENT & PLAN Gabriel Carroll 56 y.o. male with medical history significant for atrial fibrillation, low testosterone, and partial liver removal for transplant who presents for evaluation of thrombocytopenia.  After review of the labs, review of the records, and discussion with the patient the patients findings are most consistent with thrombocytopenia of unclear etiology, though  suspect may be secondary to removal of part of his liver for liver transplant.  Thrombocytopenia is a common condition with a broad differential. The possible etiologies of thrombocytopenia include liver disease, splenomegaly, infectious process, nutritional deficiency, consumption/autoimmune destruction, pseudothrombocytopenia, and bone marrow disorders. Cirrhosis and liver disease the most common causes of moderate thrombocytopenia. Evaluation should include full hepatitis serologies (Hep B and C) as well as HIV. Imaging of the liver spleen should be performed with an abdominal US ( if prior imaging is no readily available). Nutritional etiologies should be ruled out with Vitamin b12 and folate testing.  A peripheral blood smear can help determine if there is clumping leading to pseudothrombocytopenia. If no clear etiology can be  found would need to consider immune thrombocytopenia (ITP) with consideration of bone marrow biopsy.  #Thrombocytopenia, chronic --will order CBC, CMP, Vitamin b12 and folate.  --will review peripheral blood film to r/o clumping --viral serologies with Hepatitis B, Hepatitis C, and HIV --evaluate for liver disease/splenomegaly with abdominal US. If evidence of liver disease will make referral to gastroenterology.  -- May be secondary to the fact that patient had a large portion of his liver removed to be a liver donor to his brother. --RTC pending the results of the above studies  Orders Placed This Encounter  Procedures   US Abdomen Complete    Standing Status:   Future    Standing Expiration Date:   03/05/2023    Order Specific Question:   Reason for Exam (SYMPTOM  OR DIAGNOSIS REQUIRED)    Answer:   thrombocytopenia    Order Specific Question:   Preferred imaging location?    Answer:   [facility]   CBC with Differential (Cancer Center Only)    Standing Status:   Future    Number of Occurrences:   1    Standing Expiration Date:   03/06/2023   CMP ([place] only)    Standing Status:   Future    Number of Occurrences:   1    Standing Expiration Date:   03/06/2023   Immature Platelet Fraction    Standing Status:   Future    Number of Occurrences:   1    Standing Expiration Date:   03/06/2023   Vitamin B12    Standing Status:   Future    Number of Occurrences:   1    Standing Expiration Date:   03/05/2023   Folate, Serum    Standing Status:   Future    Number of Occurrences:   1    Standing Expiration Date:   03/05/2023   Save Smear [facility])    Standing Status:   Future    Number of Occurrences:   1    Standing Expiration Date:   03/06/2023    All questions were answered. The patient knows to call the clinic with any problems, questions or concerns.  A total of more than 60 minutes were spent on this encounter with face-to-face time and non-face-to-face time,  including preparing to see the patient, ordering tests and/or medications, counseling the patient and coordination of care as outlined above.   Ledell Peoples, MD Department of Hematology/Oncology [place] [facility] Phone: [phone] Pager: [phone] Email: [email or web address]  03/09/2022 9:07 PM

## 2022-03-10 ENCOUNTER — Telehealth: Payer: Self-pay | Admitting: *Deleted

## 2022-03-10 NOTE — Telephone Encounter (Signed)
LM with note below 

## 2022-03-10 NOTE — Telephone Encounter (Signed)
-----   Message from Orson Slick, MD sent at 03/10/2022  8:53 AM EDT ----- Please let Mr. Iwan know that his platelets returned to their baseline of around 130.  All other lab work was unremarkable.  I would encourage him to proceed with the ultrasound of the abdomen to assess his liver and spleen.  ----- Message ----- From: Buel Ream, Lab In [place] Sent: 03/05/2022  10:00 AM EDT To: Orson Slick, MD

## 2022-03-16 ENCOUNTER — Ambulatory Visit (HOSPITAL_COMMUNITY)
Admission: RE | Admit: 2022-03-16 | Discharge: 2022-03-16 | Disposition: A | Discharge status: Home / Self Care | Payer: BC Managed Care – PPO | Source: Ambulatory Visit | Attending: Hematology and Oncology | Admitting: Hematology and Oncology

## 2022-03-16 DIAGNOSIS — N281 Cyst of kidney, acquired: Secondary | ICD-10-CM | POA: Diagnosis not present

## 2022-03-16 DIAGNOSIS — D696 Thrombocytopenia, unspecified: Secondary | ICD-10-CM | POA: Diagnosis not present

## 2022-03-16 DIAGNOSIS — K7689 Other specified diseases of liver: Secondary | ICD-10-CM | POA: Diagnosis not present

## 2022-03-16 DIAGNOSIS — R161 Splenomegaly, not elsewhere classified: Secondary | ICD-10-CM | POA: Diagnosis not present

## 2022-03-19 ENCOUNTER — Telehealth: Payer: Self-pay | Admitting: *Deleted

## 2022-03-19 DIAGNOSIS — D696 Thrombocytopenia, unspecified: Secondary | ICD-10-CM

## 2022-03-19 NOTE — Telephone Encounter (Signed)
Received vm message from pt inquiring about the results of his liver/spleen US done on 03/16/22  Please advise

## 2022-03-19 NOTE — Telephone Encounter (Signed)
TCT patient regarding Liver/Spleen US.Mild splenomegaly, likely cause of mildly low Plt. He bounced back to his baseline of approximately 130. Spoke with him. Advised that the above is the cause of low platelets and the nodularity of liver contour likely due to the fact that he donated part of his liver  in the past. No action needed. Will refer to GI for liver f/u

## 2022-04-03 ENCOUNTER — Ambulatory Visit (INDEPENDENT_AMBULATORY_CARE_PROVIDER_SITE_OTHER): Payer: BC Managed Care – PPO | Admitting: Gastroenterology

## 2022-04-03 ENCOUNTER — Encounter: Payer: Self-pay | Admitting: Gastroenterology

## 2022-04-03 VITALS — BP 120/62 | HR 67 | Ht 70.0 in | Wt 224.0 lb

## 2022-04-03 DIAGNOSIS — R932 Abnormal findings on diagnostic imaging of liver and biliary tract: Secondary | ICD-10-CM

## 2022-04-03 DIAGNOSIS — R161 Splenomegaly, not elsewhere classified: Secondary | ICD-10-CM

## 2022-04-03 NOTE — Progress Notes (Signed)
[name]:  History: Gabriel Carroll 04/03/2022  Referring provider: Dr. Jeanie Carroll  Reason for consult/chief complaint: thrombocytopenia (Pt states he feels good although he occasionally has RUQ abdominal pain)   Subjective  HPI: This 56 year old man was referred by Dr. Leonides Carroll of hematology for thrombocytopenia and concern for underlying liver disease.  The patient's history is nicely outlined in Dr. Derek Carroll recent office consult note.  Of note, this patient had a partial hepatectomy as a living related donor for his brother over 20 years ago, and he reportedly had some postoperative complications.  Gabriel Carroll is not known to have any personal or family history of liver disease other than his brothers viral hepatitis that led to cirrhosis.  He has modest alcohol intake, is not known to have fatty liver and otherwise no clear risk factors for cirrhosis. He has had no chronic bleeding problems (GI or otherwise) and feels well.  Occasionally he has a nonspecific ache in the right side of the abdomen but nothing he had considered to be a problem.  Gabriel Carroll had a screening colonoscopy with me in April 2017, the procedure was completed to the terminal ileum with excellent preparation and a diminutive tubular adenoma removed. ROS:  Review of Systems  Constitutional:  Negative for appetite change and unexpected weight change.  HENT:  Negative for mouth sores and voice change.   Eyes:  Negative for pain and redness.  Respiratory:  Negative for cough and shortness of breath.   Cardiovascular:  Negative for chest pain and palpitations.  Genitourinary:  Negative for dysuria and hematuria.  Musculoskeletal:  Positive for arthralgias. Negative for myalgias.  Skin:  Negative for pallor and rash.  Neurological:  Negative for weakness and headaches.  Hematological:  Negative for adenopathy.     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   Low testosterone    Obesity (BMI 30-39.9)    Pleural effusion    Procedure and treatment not carried out because of patient's decision for other reasons 08/04/2012   Sleep apnea    no CPAP     Past Surgical History: Past Surgical History:  Procedure Laterality Date   BUNIONECTOMY     CHOLECYSTECTOMY     foot sugery  08/2020   HARDWARE REMOVAL Left 08/22/2020   Procedure: Left foot 1st metatarsal removal of deep implant;  Surgeon: Gabriel Arthurs, MD;  Location: [facility];  Service: Orthopedics;  Laterality: Left;    KNEE CARTILAGE SURGERY     lt   LIGAMENT REPAIR  08/22/2020   Procedure: LIGAMENT REPAIR;  Surgeon: Gabriel Arthurs, MD;  Location: [facility];  Service: Orthopedics;;   liver doner     lung surgey     METATARSAL OSTEOTOMY  08/22/2020   Procedure: METATARSAL OSTEOTOMY;  Surgeon: Gabriel Arthurs, MD;  Location: [facility];  Service: Orthopedics;;   shoulder surgery rt     WEIL OSTEOTOMY Left 08/22/2020   Procedure: 1st metatarsal Scarf osteotomy; 2nd metatarsal Weil osteotomy; repair of lateral collateral ligament;  Surgeon: Gabriel Arthurs, MD;  Location: [facility];  Service: Orthopedics;  Laterality:  Left;     Family History: Family History  Problem Relation Age of Onset   Hypertension Mother    Arthritis Mother    Hypertension Father    Cancer Father    Heart disease Father    Diabetes Paternal Aunt    Diabetes Paternal Uncle    Diabetes Sister    Diabetes Brother    Asthma Maternal Grandmother    Heart disease Maternal Grandfather    Diabetes Paternal Grandmother    Colon cancer Neg Hx    Esophageal  cancer Neg Hx    Rectal cancer Neg Hx    Stomach cancer Neg Hx     Social History: Social History   Socioeconomic History   Marital status: Married    Spouse name: Not on file   Number of children: Not on file   Years of education: Not on file   Highest education level: Not on file  Occupational History   Not on file  Tobacco Use   Smoking status: Never   Smokeless tobacco: Never  Vaping Use   Vaping Use: Never used  Substance and Sexual Activity   Alcohol use: Yes    Alcohol/week: 5.0 standard drinks of alcohol    Types: 5 Cans of beer per week    Comment: occ   Drug use: No   Sexual activity: Not on file  Other Topics Concern   Not on file  Social History Narrative   Married.   2 children.   Works as a Engineer, drilling.   Enjoys mountain biking, brewing beers, hiking.    Social Determinants of Health   Financial Resource Strain: Not on file  Food Insecurity: Not on file  Transportation Needs: Not on file  Physical Activity: Not on file  Stress: Not on file  Social Connections: Not on file    Allergies: Allergies  Allergen Reactions   Vancomycin Other (See Comments)    Red man syndrome    Outpatient Meds: Current Outpatient Medications  Medication Sig Dispense Refill   clomiPHENE (CLOMID) 50 MG tablet Take 50 mg by mouth daily.     tadalafil (CIALIS) 5 MG tablet      acetaminophen (TYLENOL) 500 MG tablet Take 500 mg by mouth every 6 (six) hours as needed for mild pain, moderate pain or headache.      ibuprofen (ADVIL,MOTRIN) 200 MG tablet Take 400 mg by mouth daily as needed.     meloxicam (MOBIC) 15 MG tablet Take 15 mg by mouth as needed.     No current facility-administered medications for this visit.      ___________________________________________________________________ Objective   Exam:  BP 120/62   Pulse 67   Ht 5\' 10"  (1.778 m)   Wt 224 lb (101.6 kg)   BMI 32.14 kg/m  Wt Readings from Last 3  Encounters:  04/03/22 224 lb (101.6 kg)  03/05/22 221 lb 14.4 oz (100.7 kg)  02/11/22 220 lb (99.8 kg)    General: Well-appearing Eyes: sclera anicteric, no redness ENT: oral mucosa moist without lesions, no cervical or supraclavicular lymphadenopathy CV: Regular without murmur, no JVD, no peripheral edema Resp: clear to auscultation bilaterally, normal RR and effort noted GI: soft, chevron incision, no tenderness, with active bowel sounds.  No palpable hepatomegaly or splenomegaly.  No bulging flanks shifting dullness, no caput medusa Skin; warm and dry, no rash or jaundice noted.  No spider nevi Neuro: awake, alert and oriented x 3. Normal gross  motor function and fluent speech  Labs:     Latest Ref Rng & Units 03/05/2022    9:49 AM 02/11/2022   10:05 AM 09/26/2017   12:28 AM  CBC  WBC 4.0 - 10.5 K/uL 5.3  5.3  9.7   Hemoglobin 13.0 - 17.0 g/dL 78.2  95.6  21.3   Hematocrit 39.0 - 52.0 % 46.3  48.3  44.6   Platelets 150 - 400 K/uL 132  118.0  133       Latest Ref Rng & Units 03/05/2022    9:49 AM 02/11/2022   10:05 AM 04/20/2018    9:23 AM  CMP  Glucose 70 - 99 mg/dL 086  93  578   BUN 6 - 20 mg/dL 13  16  20    Creatinine 0.61 - 1.24 mg/dL 4.69  6.29  5.28   Sodium 135 - 145 mmol/L 139  138  140   Potassium 3.5 - 5.1 mmol/L 3.9  4.2  4.6   Chloride 98 - 111 mmol/L 106  104  103   CO2 22 - 32 mmol/L 28  29  32   Calcium 8.9 - 10.3 mg/dL 9.7  9.7  9.7   Total Protein 6.5 - 8.1 g/dL 7.4  7.5  7.1   Total Bilirubin 0.3 - 1.2 mg/dL 0.7  0.7  0.6   Alkaline Phos 38 - 126 U/L 71  69  76   AST 15 - 41 U/L 21  24  25    ALT 0 - 44 U/L 19  22  28     No INR since 2018 (normal at that time)  No iron studies on file  Viral hepatitis panel was planned by Dr. Leonides Carroll, though I see no results.  Radiologic Studies:  Narrative & Impression CLINICAL DATA:  Thrombocytopenia   EXAM: ABDOMEN ULTRASOUND COMPLETE   COMPARISON:  None Available.   FINDINGS: Gallbladder: Surgically  absent.   Common bile duct: Diameter: 5 mm   Liver: Mildly nodular contour with increased echogenicity of the parenchyma. No focal mass identified. Portal vein is patent on color Doppler imaging with normal direction of blood flow towards the liver.   IVC: No abnormality visualized.   Pancreas: Visualized portion unremarkable.   Spleen: Enlarged measuring 14.6 cm in length.   Right Kidney: Length: 13.9 cm. Echogenicity within normal limits. No mass or hydronephrosis visualized.   Left Kidney: Length: 14.6 cm. Echogenicity within normal limits. 2.4 cm anechoic cyst in the midpole. No suspicious mass or hydronephrosis visualized.   Abdominal aorta: No aneurysm visualized.   Other findings: None.   IMPRESSION: 1. Mildly nodular contour of the liver with increased echogenicity of the parenchyma, correlate for cirrhosis. 2. Splenomegaly. 3. Left renal cyst.     Electronically Signed   By: Jannifer Hick M.D.   On: 03/16/2022 15:38   Assessment: Encounter Diagnoses  Name Primary?   Abnormal ultrasound of liver Yes   Splenomegaly     Overall clinical picture highly suggestive of cirrhosis, but more detailed imaging needed for greater degree of certainty.  If confirmed, cause as yet unclear.  Normal LFTs speaks somewhat against chronic autoimmune liver disease or viral hepatitis.  He also recalls getting vaccinated for hepatitis B many years ago, and he would have been screened for viral hepatitis over 20 years ago as a candidate for living related donor.  Unclear if his prior partial hepatectomy could have somehow led to anatomic changes in the hepatic parenchyma or vasculature to lead  to cirrhosis.  (I will confer with transplant colleagues for more information)  CT abdomen with oral and IV contrast ordered.  Plan: If CT scan confirms cirrhosis, plan will be for EGD to screen for varices (explained to him along with diagrams of the anatomy) and further blood work to  include the following:  Hepatitis A, B, and C serologies, iron studies, alpha-1 antitrypsin level, AMA, ANA, total IgG, smooth muscle antibody, celiac serologies, ceruloplasmin, PT/INR and AFP.  7-year recall (April 2024) for history of colon polyp.   Thank you for the courtesy of this consult.  Please call me with any questions or concerns.  Gabriel Carroll   CC: Dr. Jeanie Carroll and PCP

## 2022-04-07 ENCOUNTER — Ambulatory Visit (HOSPITAL_COMMUNITY)
Admission: RE | Admit: 2022-04-07 | Discharge: 2022-04-07 | Disposition: A | Discharge status: Home / Self Care | Payer: BC Managed Care – PPO | Source: Ambulatory Visit | Attending: Gastroenterology | Admitting: Gastroenterology

## 2022-04-07 DIAGNOSIS — R161 Splenomegaly, not elsewhere classified: Secondary | ICD-10-CM | POA: Diagnosis not present

## 2022-04-07 DIAGNOSIS — R932 Abnormal findings on diagnostic imaging of liver and biliary tract: Secondary | ICD-10-CM | POA: Diagnosis not present

## 2022-04-07 DIAGNOSIS — K760 Fatty (change of) liver, not elsewhere classified: Secondary | ICD-10-CM | POA: Diagnosis not present

## 2022-04-07 DIAGNOSIS — Z944 Liver transplant status: Secondary | ICD-10-CM | POA: Diagnosis not present

## 2022-04-07 MED ORDER — IOHEXOL 300 MG/ML  SOLN
100.0000 mL | Freq: Once | INTRAMUSCULAR | Status: AC | PRN
  Administered 2022-04-07: 100 mL via INTRAVENOUS

## 2022-04-09 ENCOUNTER — Encounter: Payer: Self-pay | Admitting: Gastroenterology

## 2022-04-09 DIAGNOSIS — R932 Abnormal findings on diagnostic imaging of liver and biliary tract: Secondary | ICD-10-CM

## 2022-04-09 DIAGNOSIS — R161 Splenomegaly, not elsewhere classified: Secondary | ICD-10-CM

## 2022-04-15 NOTE — Telephone Encounter (Signed)
The CT scan does show fatty liver and confirms that the spleen is enlarged, though it does not show [place] definitively if there is cirrhosis.  I am suspicious that there may be cirrhosis, but it may just be difficult to tell based on the appearance of the liver on scan because of his previous surgery.  As he and I discussed in the office, the next step is an upper endoscopy to see if there might be esophageal or stomach varices and some lab work.   - Please arrange an EGD in the [place] as well as the following blood work:   - Hepatitis A, B, and C serologies, iron studies, alpha-1 antitrypsin level, AMA, ANA, total IgG, smooth muscle antibody, celiac serologies, ceruloplasmin, PT/INR and AFP.  - HD

## 2022-04-15 NOTE — Telephone Encounter (Signed)
Called and spoke with patient regarding his CT results and Dr. Loletha Carrow' recommendations. Pt has been scheduled for an EGD on Thursday, 04/23/22 at 10 am. Pt is aware that he will need to arrive in the [facility] by 9 am with a care partner. Pt denies being a diabetic or being on anticoag therapy. Pt is aware that I will send his EGD instructions to his MyChart. Pt is aware that I have placed lab orders and he can stop by the lab in the basement of the office at his convenience to have labs draw. Pt advised that no appt is necessary for labs and he does not have to fast. Pt states that he may go by the lab today. Pt verbalized understanding of all information and had no concerns at the end of the call.   EGD instructions sent to pt via MyChart. Ambulatory referral to GI in epic. Lab orders in epic.

## 2022-04-16 ENCOUNTER — Other Ambulatory Visit: Payer: BC Managed Care – PPO

## 2022-04-16 ENCOUNTER — Other Ambulatory Visit (INDEPENDENT_AMBULATORY_CARE_PROVIDER_SITE_OTHER): Payer: BC Managed Care – PPO

## 2022-04-16 DIAGNOSIS — R161 Splenomegaly, not elsewhere classified: Secondary | ICD-10-CM

## 2022-04-16 DIAGNOSIS — R932 Abnormal findings on diagnostic imaging of liver and biliary tract: Secondary | ICD-10-CM | POA: Diagnosis not present

## 2022-04-16 DIAGNOSIS — K7460 Unspecified cirrhosis of liver: Secondary | ICD-10-CM | POA: Diagnosis not present

## 2022-04-16 LAB — IBC + FERRITIN
Ferritin: 134.4 ng/mL (ref 22.0–322.0)
Iron: 106 ug/dL (ref 42–165)
Saturation Ratios: 25.2 % (ref 20.0–50.0)
TIBC: 421.4 ug/dL (ref 250.0–450.0)
Transferrin: 301 mg/dL (ref 212.0–360.0)

## 2022-04-16 LAB — PROTIME-INR
INR: 1 ratio (ref 0.8–1.0)
Prothrombin Time: 10.9 s (ref 9.6–13.1)

## 2022-04-23 ENCOUNTER — Encounter: Payer: Self-pay | Admitting: Gastroenterology

## 2022-04-23 ENCOUNTER — Ambulatory Visit (AMBULATORY_SURGERY_CENTER): Payer: BC Managed Care – PPO | Admitting: Gastroenterology

## 2022-04-23 VITALS — BP 108/61 | HR 51 | Temp 97.1°F | Resp 18 | Ht 70.0 in | Wt 224.0 lb

## 2022-04-23 DIAGNOSIS — K7460 Unspecified cirrhosis of liver: Secondary | ICD-10-CM | POA: Diagnosis not present

## 2022-04-23 LAB — HEPATITIS B SURFACE ANTIBODY,QUALITATIVE: Hep B S Ab: REACTIVE — AB

## 2022-04-23 LAB — TISSUE TRANSGLUTAMINASE ABS,IGG,IGA
(tTG) Ab, IgA: <1 U/mL
(tTG) Ab, IgG: <1 U/mL

## 2022-04-23 LAB — HEPATITIS B SURFACE ANTIGEN: Hepatitis B Surface Ag: NONREACTIVE

## 2022-04-23 LAB — HEPATITIS A ANTIBODY, TOTAL: Hepatitis A AB,Total: REACTIVE — AB

## 2022-04-23 LAB — MITOCHONDRIAL ANTIBODIES: Mitochondrial M2 Ab, IgG: <=20 U (ref <=20.0)

## 2022-04-23 LAB — HEPATITIS B CORE ANTIBODY, TOTAL: Hep B Core Total Ab: NONREACTIVE

## 2022-04-23 LAB — ALPHA-1-ANTITRYPSIN: A-1 Antitrypsin, Ser: 160 mg/dL (ref 83–199)

## 2022-04-23 LAB — AFP TUMOR MARKER: AFP-Tumor Marker: 1.8 ng/mL (ref <6.1)

## 2022-04-23 LAB — ANA: Anti Nuclear Antibody (ANA): NEGATIVE

## 2022-04-23 LAB — IGA: Immunoglobulin A: 119 mg/dL (ref 47–310)

## 2022-04-23 LAB — CERULOPLASMIN: Ceruloplasmin: 33 mg/dL (ref 18–36)

## 2022-04-23 LAB — ANTI-SMOOTH MUSCLE ANTIBODY, IGG: Actin (Smooth Muscle) Antibody (IGG): <20 U (ref <20)

## 2022-04-23 LAB — IGG: IgG (Immunoglobin G), Serum: 1336 mg/dL (ref 600–1640)

## 2022-04-23 LAB — HEPATITIS C ANTIBODY: Hepatitis C Ab: NONREACTIVE

## 2022-04-23 MED ORDER — SODIUM CHLORIDE 0.9 % IV SOLN: 500.0000 mL | Freq: Once | INTRAVENOUS | Status: DC

## 2022-04-23 NOTE — Progress Notes (Signed)
History and Physical:  This patient presents for endoscopic testing for: Encounter Diagnosis  Name Primary?   Cirrhosis, non-alcoholic (HCC) Yes   Splenomegaly and thrombocytopenia with cirrhotic appearing liver on imaging.  Negative serologic work-up, suspected underlying fatty liver. Upper endoscopy for variceal screening.  More clinical details in office consult note of 04/03/2022  Patient is otherwise without complaints or active issues today.   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   Low testosterone    Obesity (BMI 30-39.9)    Pleural effusion    Procedure and treatment not carried out because of patient's decision for other reasons 08/04/2012   Sleep apnea    no CPAP     Past Surgical History: Past Surgical History:  Procedure Laterality Date   BUNIONECTOMY     CHOLECYSTECTOMY     foot sugery  08/2020   HARDWARE REMOVAL Left 08/22/2020   Procedure: Left foot 1st metatarsal removal of deep implant;  Surgeon: Wylene Simmer, MD;  Location: [facility];  Service: Orthopedics;  Laterality: Left;  36mn   KNEE CARTILAGE SURGERY     lt   LIGAMENT REPAIR  08/22/2020   Procedure: LIGAMENT REPAIR;  Surgeon: HWylene Simmer MD;  [place] [facility]  Service: Orthopedics;;   liver doner     lung surgey     METATARSAL OSTEOTOMY  08/22/2020   Procedure: METATARSAL OSTEOTOMY;  Surgeon: HWylene Simmer MD;  [place] [facility]  Service: Orthopedics;;   shoulder surgery rt     WEIL OSTEOTOMY Left 08/22/2020   Procedure: 1st metatarsal Scarf osteotomy; 2nd metatarsal Weil osteotomy; repair of lateral collateral  ligament;  Surgeon: HWylene Simmer MD;  [place] [facility]  Service: Orthopedics;  Laterality: Left;    Allergies: Allergies  Allergen Reactions   Vancomycin Other (See Comments)    Red man syndrome    Outpatient Meds: Current Outpatient Medications  Medication Sig Dispense Refill   clomiPHENE (CLOMID) 50 MG tablet Take 50 mg by mouth daily.     tadalafil (CIALIS) 5 MG tablet      acetaminophen (TYLENOL) 500 MG tablet Take 500 mg by mouth every 6 (six) hours as needed for mild pain, moderate pain or headache.      ibuprofen (ADVIL,MOTRIN) 200 MG tablet Take 400 mg by mouth daily as needed.     meloxicam (MOBIC) 15 MG tablet Take 15 mg by mouth as needed.     Current Facility-Administered Medications  Medication Dose Route Frequency Provider Last Rate Last Admin   0.9 %  sodium chloride infusion  500 mL Intravenous Once Danis, HEstill CottaIII, MD          ___________________________________________________________________ Objective   Exam:  BP 113/71   Pulse (!) 57   Temp (!) 97.1 F (36.2 C) (Temporal)   Resp 13   Ht '5\' 10"'$  (1.778 m)   Wt 224 lb (101.6 kg)   SpO2 96%   BMI 32.14 kg/m   CV: RRR without murmur, S1/S2 Resp: clear to auscultation bilaterally, normal RR and effort noted GI: soft, no tenderness, with active bowel sounds.  MELD 6  Assessment: Encounter Diagnosis  Name Primary?   Cirrhosis, non-alcoholic ([place]) Yes     Plan: EGD  The benefits and risks of the planned procedure were described in detail with the patient or (when appropriate) their health care proxy.  Risks were outlined as including, but not limited to, bleeding, infection, perforation, adverse medication reaction leading to cardiac or pulmonary decompensation, pancreatitis (if ERCP).  The limitation of incomplete mucosal visualization was also discussed.  No guarantees or warranties were given.    The patient is appropriate for an endoscopic procedure in the ambulatory  setting.   - Wilfrid Lund, MD

## 2022-04-23 NOTE — Patient Instructions (Signed)
Thank you for coming in to see [place] today! Resume previous diet and medications today. Return to normal daily activities tomorrow. Recommend office follow-up in 5 months. Recommend screening upper endoscopy in 3 years.  YOU HAD AN ENDOSCOPIC PROCEDURE TODAY AT [facility]:   Refer to the procedure report that was given to you for any specific questions about what was found during the examination.  If the procedure report does not answer your questions, please call your gastroenterologist to clarify.  If you requested that your care partner not be given the details of your procedure findings, then the procedure report has been included in a sealed envelope for you to review at your convenience later.  YOU SHOULD EXPECT: Some feelings of bloating in the abdomen. Passage of more gas than usual.  Walking can help get rid of the air that was put into your GI tract during the procedure and reduce the bloating. If you had a lower endoscopy (such as a colonoscopy or flexible sigmoidoscopy) you may notice spotting of blood in your stool or on the toilet paper. If you underwent a bowel prep for your procedure, you may not have a normal bowel movement for a few days.  Please Note:  You might notice some irritation and congestion in your nose or some drainage.  This is from the oxygen used during your procedure.  There is no need for concern and it should clear up in a day or so.  SYMPTOMS TO REPORT IMMEDIATELY:    Following upper endoscopy (EGD)  Vomiting of blood or coffee ground material  New chest pain or pain under the shoulder blades  Painful or persistently difficult swallowing  New shortness of breath  Fever of 100F or higher  Black, tarry-looking stools  For urgent or emergent issues, a gastroenterologist can be reached at any hour by calling [phone]. Do not use MyChart messaging for urgent concerns.    DIET:  We do recommend a small meal at first, but then you may  proceed to your regular diet.  Drink plenty of fluids but you should avoid alcoholic beverages for 24 hours.  ACTIVITY:  You should plan to take it easy for the rest of today and you should NOT DRIVE or use heavy machinery until tomorrow (because of the sedation medicines used during the test).    FOLLOW UP: Our staff will call the number listed on your records the next business day following your procedure.  We will call around 7:15- 8:00 am to check on you and address any questions or concerns that you may have regarding the information given to you following your procedure. If we do not reach you, we will leave a message.  If you develop any symptoms (ie: fever, flu-like symptoms, shortness of breath, cough etc.) before then, please call [phone].  If you test positive for Covid 19 in the 2 weeks post procedure, please call and report this information to [place].    If any biopsies were taken you will be contacted by phone or by letter within the next 1-3 weeks.  Please call us at [phone] if you have not heard about the biopsies in 3 weeks.    SIGNATURES/CONFIDENTIALITY: You and/or your care partner have signed paperwork which will be entered into your electronic medical record.  These signatures attest to the fact that that the information above on your After Visit Summary has been reviewed and is understood.  Full responsibility of the confidentiality of this  discharge information lies with you and/or your care-partner.  

## 2022-04-23 NOTE — Progress Notes (Signed)
Report given to PACU, vss 

## 2022-04-23 NOTE — Progress Notes (Signed)
1014 Robinul 0.1 mg IV given due large amount of secretions upon assessment.  MD made aware, vss  

## 2022-04-23 NOTE — Op Note (Signed)
[facility] Patient Name: Gabriel Carroll Procedure Date: 04/23/2022 10:04 AM MRN: [id number] Endoscopist: [place]. Loletha Carrow , MD Age: 56 Referring MD:  Date of Birth: 15-Jul-1966 Gender: Male Account #: [id number] Procedure:                Upper GI endoscopy Indications:              Cirrhosis rule out esophageal varices                           MELD = 6, no ascites or HE                           Suspected NAFLD as cause - serologic testing                            negative                           Splenomegaly and platelets 130K Medicines:                Monitored Anesthesia Care Procedure:                Pre-Anesthesia Assessment:                           - Prior to the procedure, a History and Physical                            was performed, and patient medications and                            allergies were reviewed. The patient's tolerance of                            previous anesthesia was also reviewed. The risks                            and benefits of the procedure and the sedation                            options and risks were discussed with the patient.                            All questions were answered, and informed consent                            was obtained. Prior Anticoagulants: The patient has                            taken no previous anticoagulant or antiplatelet                            agents. ASA Grade Assessment: II - A patient with  mild systemic disease. After reviewing the risks                            and benefits, the patient was deemed in                            satisfactory condition to undergo the procedure.                           After obtaining informed consent, the endoscope was                            passed under direct vision. Throughout the                            procedure, the patient's blood pressure, pulse, and                            oxygen saturations were monitored  continuously. The                            Endoscope was introduced through the mouth, and                            advanced to the second part of duodenum. The upper                            GI endoscopy was accomplished without difficulty.                            The patient tolerated the procedure well. Scope In: Scope Out: Findings:                 The larynx was normal.                           The esophagus was normal except for two small inlet                            patches (benign finding).                           The stomach was normal.                           The cardia and gastric fundus were normal on                            retroflexion.                           The examined duodenum was normal. Complications:            No immediate complications. Estimated Blood Loss:     Estimated blood loss: none. Impression:               - Normal  larynx.                           - Normal esophagus. Inlet patches                           - Normal stomach.                           - Normal examined duodenum.                           - No specimens collected. Recommendation:           - Patient has a contact number available for                            emergencies. The signs and symptoms of potential                            delayed complications were discussed with the                            patient. Return to normal activities tomorrow.                            Written discharge instructions were provided to the                            patient.                           - Low carbohydrate indefinitely.                           - Continue present medications.                           - Repeat upper endoscopy in 3 years for screening                            purposes.                           - Return to GI clinic in 5 months. Kayliee Atienza L. Loletha Carrow, MD 04/23/2022 10:40:46 AM This report has been signed electronically.

## 2022-04-24 ENCOUNTER — Telehealth: Payer: Self-pay

## 2022-04-24 NOTE — Telephone Encounter (Signed)
Follow up call placed, VM obtained and message left. ?SChaplin, RN,BSN ? ?

## 2022-06-02 ENCOUNTER — Encounter: Payer: Self-pay | Admitting: Gastroenterology

## 2022-06-02 ENCOUNTER — Ambulatory Visit (INDEPENDENT_AMBULATORY_CARE_PROVIDER_SITE_OTHER): Payer: BC Managed Care – PPO | Admitting: *Deleted

## 2022-06-02 DIAGNOSIS — K7460 Unspecified cirrhosis of liver: Secondary | ICD-10-CM

## 2022-06-02 DIAGNOSIS — M25511 Pain in right shoulder: Secondary | ICD-10-CM | POA: Diagnosis not present

## 2022-06-02 DIAGNOSIS — K760 Fatty (change of) liver, not elsewhere classified: Secondary | ICD-10-CM

## 2022-06-02 DIAGNOSIS — Z23 Encounter for immunization: Secondary | ICD-10-CM

## 2022-06-02 NOTE — Telephone Encounter (Signed)
I do not recall if we discussed that, but certainly reasonable if he would like a referral to [facility] nutritionist.  - HD

## 2022-06-03 NOTE — Telephone Encounter (Signed)
Ambulatory referral to Nutrition in epic. Patient notified via [place].

## 2022-06-10 DIAGNOSIS — M25561 Pain in right knee: Secondary | ICD-10-CM | POA: Diagnosis not present

## 2022-06-24 ENCOUNTER — Encounter: Payer: Self-pay | Admitting: Dietician

## 2022-06-24 ENCOUNTER — Encounter: Payer: BC Managed Care – PPO | Attending: Gastroenterology | Admitting: Dietician

## 2022-06-24 DIAGNOSIS — K760 Fatty (change of) liver, not elsewhere classified: Secondary | ICD-10-CM

## 2022-06-24 NOTE — Progress Notes (Signed)
Medical Nutrition Therapy  Appointment Start time:  [phone]  Appointment End time:  0900  Primary concerns today: Pt wants to know what foods to limit and overall nutrition with fatty liver.    Referral diagnosis: fatty liver Preferred learning style: no preference indicated Learning readiness: ready   NUTRITION ASSESSMENT   Anthropometrics  Ht: 70 in Wt: 206 lbs  Clinical Medical Hx: arthritis, sleep apnea, fatty liver Medications: reviewed Labs: reviewed Notable Signs/Symptoms: none  Lifestyle & Dietary Hx Pt states he currently uses noom app to get nutrition tips. Since his diagnosis of NAFLD, he states he has been recently following a mediterranean style diet.   Pt previously had a heavy meat diet, including smoked meats, steak, burgers often. He states he recently changed his diet a lot. He has increased fruit and vegetable intake, and now consumes chicken, [place] and fish over red meats. Pt states he used to do chips/crackers, etc, for snacks but has now opted for fruit.  Estimated daily fluid intake: 64+ oz Supplements: none Sleep: 7-8 hours, no issues Stress / self-care: mild stress Current average weekly physical activity: [employer] 3x/wk 30 minutes, gym 3x/wk 1 hour, walks dog a few miles daily.   24-Hr Dietary Recall First Meal: banana and boiled egg OR banana and oats with blueberries and walnuts Snack: fruit Second Meal: healthy choice simply steamer with siggis yogurt or fruit OR out to eat (salad with grilled shrimp) Snack: fruit OR cashews  Third Meal: eats out 1x/wk OR cooks chicken or salmon with salad Snack: yasso yogurt bar OR grapes Beverages: coffee with 1 tsp sugar, water, sparkling water, green tea with turmeric. Hasn't been drinking alcohol since June.   NUTRITION DIAGNOSIS  NB-1.1 Food and nutrition-related knowledge deficit As related to non-alcoholic fatty liver disease.  As evidenced by pt report and diet history.   NUTRITION INTERVENTION   Nutrition education (E-1) on the following topics:  Omega 3's vs omega 6's Saturated vs unsaturated fat Increasing fiber intake [employer] balanced snacks Plant-based meals Reducing intake of saturated/trans fats, alcohol, added sugars, refined grains Increasing intake of fruits, vegetables and whole grains  Handouts Provided Include  Snack sheet Dish Up a Healthy Meal  Learning Style & Readiness for Change Teaching method utilized: Visual & Auditory  Demonstrated degree of understanding via: Teach Back  Barriers to learning/adherence to lifestyle change: none  Goals Established by Pt limiting your intake of fats, which are high in calories.  replacing saturated fats and trans fats in your diet with unsaturated fats, especially omega-3 fatty acids, which may reduce your chance of heart disease if you have NAFLD.  Foods higher in omega 3's - fatty fish, hemp seeds, chia seeds, flax seeds, walnuts  Choose whole grain or whole wheat over refined grains.   If you have NAFLD, you should minimize alcohol use, which can further damage your liver.  When snacking, pair a carbohydrate with a protein.    MONITORING & EVALUATION Dietary intake, weekly physical activity, and follow up in 4 months.  Next Steps  Patient is to call for questions.

## 2022-06-24 NOTE — Patient Instructions (Addendum)
limiting your intake of fats, which are high in calories.  replacing saturated fats and trans fats in your diet with unsaturated fats, especially omega-3 fatty acids, which may reduce your chance of heart disease if you have NAFLD.  Foods higher in omega 3's - fatty fish, hemp seeds, chia seeds, flax seeds, walnuts  Choose whole grain or whole wheat over refined grains.   If you have NAFLD, you should minimize alcohol use, which can further damage your liver.  When snacking, pair a carbohydrate with a protein.   Tips to increase fiber in your diet: (All plants have fiber.  Eat a variety. There are more than are on this list.) Slowly increase the amount of fiber you eat to 25-35 grams per day.  (More is fine if you tolerate it.) Fiber from whole grains, nuts and seeds Quinoa, 1/2 cup = 5 grams Bulgur, 1/2 cup = 4.1 grams Popcorn, 3 cups = 3.6 grams Whole Wheat Spaghetti, 1/2 cup = 3.2 grams Barley, 1/2 cup = 3 grams Oatmeal, 1/2 cup = 2 grams Whole Wheat English Muffin = 3 grams Corn, 1/2 cup = 2.1 grams Brown Rice, 1/2 cup = 1.8 grams Flax seeds, 1 Tablespoon = 2.8 grams Chia seeds, 1 Tablespoon = 11 grams Almonds, 1 ounce = 3.5 grams fiber Fiber from legumes Kidney beans, 1/2 cup 7.9 grams Lentils, 1/2 cup = 7.8 grams Pinto beans, 1/2 cup = 7.7 grams Black beans, 1/2 cup = 7.6 grams Lima beans, 1/2 cup 6.4 grams Chick peas, 1/2 cup = 5.3 grams Black eyed peas, 1/2 cup = 4 grams Fiber from fruits and vegetables Pear, 6 grams Apple. 3.3 grams Raspberries or Blackberries, 3/4 cup = 6 grams Strawberries or Blueberries, 1 cup = 3.4 grams Baked sweet potato 3.8 grams fiber Baked potato with skin 4.4 grams  Peas, 1/2 cup = 4.4 grams  Spinach, 1/2 cup cooked = 3.5 grams  Avocado, 1/2 = 5 grams

## 2022-06-25 DIAGNOSIS — G4730 Sleep apnea, unspecified: Secondary | ICD-10-CM

## 2022-06-25 NOTE — Telephone Encounter (Signed)
Message sent to patient to let know you are out of the office. Do you need to see him before referral? Did not see anything about it in his last note.

## 2022-06-26 NOTE — Addendum Note (Signed)
Addended by: Pleas Koch on: 06/26/2022 01:17 AM   Modules accepted: Orders

## 2022-07-06 DIAGNOSIS — M948X1 Other specified disorders of cartilage, shoulder: Secondary | ICD-10-CM | POA: Diagnosis not present

## 2022-07-06 DIAGNOSIS — S43431A Superior glenoid labrum lesion of right shoulder, initial encounter: Secondary | ICD-10-CM | POA: Diagnosis not present

## 2022-07-06 DIAGNOSIS — M7521 Bicipital tendinitis, right shoulder: Secondary | ICD-10-CM | POA: Diagnosis not present

## 2022-07-06 DIAGNOSIS — M7541 Impingement syndrome of right shoulder: Secondary | ICD-10-CM | POA: Diagnosis not present

## 2022-07-06 DIAGNOSIS — Y999 Unspecified external cause status: Secondary | ICD-10-CM | POA: Diagnosis not present

## 2022-07-06 DIAGNOSIS — S46011A Strain of muscle(s) and tendon(s) of the rotator cuff of right shoulder, initial encounter: Secondary | ICD-10-CM | POA: Diagnosis not present

## 2022-07-06 DIAGNOSIS — X58XXXA Exposure to other specified factors, initial encounter: Secondary | ICD-10-CM | POA: Diagnosis not present

## 2022-07-06 DIAGNOSIS — M24111 Other articular cartilage disorders, right shoulder: Secondary | ICD-10-CM | POA: Diagnosis not present

## 2022-07-06 DIAGNOSIS — M19011 Primary osteoarthritis, right shoulder: Secondary | ICD-10-CM | POA: Diagnosis not present

## 2022-07-09 DIAGNOSIS — M75121 Complete rotator cuff tear or rupture of right shoulder, not specified as traumatic: Secondary | ICD-10-CM | POA: Diagnosis not present

## 2022-07-09 DIAGNOSIS — M6281 Muscle weakness (generalized): Secondary | ICD-10-CM | POA: Diagnosis not present

## 2022-07-09 DIAGNOSIS — M25611 Stiffness of right shoulder, not elsewhere classified: Secondary | ICD-10-CM | POA: Diagnosis not present

## 2022-07-10 ENCOUNTER — Ambulatory Visit (INDEPENDENT_AMBULATORY_CARE_PROVIDER_SITE_OTHER): Payer: BC Managed Care – PPO | Admitting: Primary Care

## 2022-07-10 ENCOUNTER — Encounter: Payer: Self-pay | Admitting: Primary Care

## 2022-07-10 VITALS — BP 122/62 | HR 57 | Temp 97.9°F | Ht 70.0 in | Wt 204.0 lb

## 2022-07-10 DIAGNOSIS — R0681 Apnea, not elsewhere classified: Secondary | ICD-10-CM

## 2022-07-10 NOTE — Assessment & Plan Note (Signed)
-   Patient has symptoms of snoring and witnessed apnea.  He has a history of A-fib.  He had a sleep study in 2009 in [place] that reportedly showed mild sleep apnea.  He is not on CPAP.  He has lost 40 pounds in the last year and snoring has improved.  Recommend getting updated home sleep study to reevaluate for OSA.  Discussed risks of untreated sleep apnea including A-fib, stroke, pulmonary hypertension or diabetes.  Reviewed treatment options including weight loss, oral appliance, CPAP therapy or referral to ENT for possible surgical options.  Advised against driving if experiencing excessive daytime sleepiness or fatigue.  Encourage side sleeping position or elevate head of bed 30 degrees.  Follow-up in 1 to 2 weeks after sleep study to review results and treatment options if needed.

## 2022-07-10 NOTE — Patient Instructions (Addendum)
Sleep apnea is defined as period of 10 seconds or longer when you stop breathing at night. This can happen multiple times a night. Dx sleep apnea is when this occurs more than 5 times an hour.    Mild OSA 5-15 apneic events an hour Moderate OSA 15-30 apneic events an hour Severe OSA > 30 apneic events an hour   Untreated sleep apnea puts you at higher risk for cardiac arrhythmias, pulmonary HTN, stroke and diabetes   Treatment options include weight loss, side sleeping position, oral appliance, CPAP therapy or referral to ENT for possible surgical options    Recommendations: Focus on side sleeping position or elevate head with wedge pillow 30 degrees Maintain BMI <30  Do not drive if experiencing excessive daytime sleepiness of fatigue    Orders: Home sleep study re: loud snoring    Follow-up: Please call to schedule follow-up 1-2 weeks after completing home sleep study to review results and treatment if needed (can be virtual)  Sleep Apnea Sleep apnea is a condition in which breathing pauses or becomes shallow during sleep. People with sleep apnea usually snore loudly. They may have times when they gasp and stop breathing for 10 seconds or more during sleep. This may happen many times during the night. Sleep apnea disrupts your sleep and keeps your body from getting the rest that it needs. This condition can increase your risk of certain health problems, including: Heart attack. Stroke. Obesity. Type 2 diabetes. Heart failure. Irregular heartbeat. High blood pressure. The goal of treatment is to help you breathe normally again. What are the causes?  The most common cause of sleep apnea is a collapsed or blocked airway. There are three kinds of sleep apnea: Obstructive sleep apnea. This kind is caused by a blocked or collapsed airway. Central sleep apnea. This kind happens when the part of the brain that controls breathing does not send the correct signals to the muscles that  control breathing. Mixed sleep apnea. This is a combination of obstructive and central sleep apnea. What increases the risk? You are more likely to develop this condition if you: Are overweight. Smoke. Have a smaller than normal airway. Are older. Are male. Drink alcohol. Take sedatives or tranquilizers. Have a family history of sleep apnea. Have a tongue or tonsils that are larger than normal. What are the signs or symptoms? Symptoms of this condition include: Trouble staying asleep. Loud snoring. Morning headaches. Waking up gasping. Dry mouth or sore throat in the morning. Daytime sleepiness and tiredness. If you have daytime fatigue because of sleep apnea, you may be more likely to have: Trouble concentrating. Forgetfulness. Irritability or mood swings. Personality changes. Feelings of depression. Sexual dysfunction. This may include loss of interest if you are male, or erectile dysfunction if you are male. How is this diagnosed? This condition may be diagnosed with: A medical history. A physical exam. A series of tests that are done while you are sleeping (sleep study). These tests are usually done in a sleep lab, but they may also be done at home. How is this treated? Treatment for this condition aims to restore normal breathing and to ease symptoms during sleep. It may involve managing health issues that can affect breathing, such as high blood pressure or obesity. Treatment may include: Sleeping on your side. Using a decongestant if you have nasal congestion. Avoiding the use of depressants, including alcohol, sedatives, and narcotics. Losing weight if you are overweight. Making changes to your diet. Quitting smoking.  Using a device to open your airway while you sleep, such as: An oral appliance. This is a custom-made mouthpiece that shifts your lower jaw forward. A continuous positive airway pressure (CPAP) device. This device blows air through a mask when you  breathe out (exhale). A nasal expiratory positive airway pressure (EPAP) device. This device has valves that you put into each nostril. A bi-level positive airway pressure (BIPAP) device. This device blows air through a mask when you breathe in (inhale) and breathe out (exhale). Having surgery if other treatments do not work. During surgery, excess tissue is removed to create a wider airway. Follow these instructions at home: Lifestyle Make any lifestyle changes that your health care provider recommends. Eat a healthy, well-balanced diet. Take steps to lose weight if you are overweight. Avoid using depressants, including alcohol, sedatives, and narcotics. Do not use any products that contain nicotine or tobacco. These products include cigarettes, chewing tobacco, and vaping devices, such as e-cigarettes. If you need help quitting, ask your health care provider. General instructions Take over-the-counter and prescription medicines only as told by your health care provider. If you were given a device to open your airway while you sleep, use it only as told by your health care provider. If you are having surgery, make sure to tell your health care provider you have sleep apnea. You may need to bring your device with you. Keep all follow-up visits. This is important. Contact a health care provider if: The device that you received to open your airway during sleep is uncomfortable or does not seem to be working. Your symptoms do not improve. Your symptoms get worse. Get help right away if: You develop: Chest pain. Shortness of breath. Discomfort in your back, arms, or stomach. You have: Trouble speaking. Weakness on one side of your body. Drooping in your face. These symptoms may represent a serious problem that is an emergency. Do not wait to see if the symptoms will go away. Get medical help right away. Call your local emergency services (911 in the [place] Do not drive yourself to the  hospital. Summary Sleep apnea is a condition in which breathing pauses or becomes shallow during sleep. The most common cause is a collapsed or blocked airway. The goal of treatment is to restore normal breathing and to ease symptoms during sleep. This information is not intended to replace advice given to you by your health care provider. Make sure you discuss any questions you have with your health care provider. Document Revised: 05/07/2021 Document Reviewed: 09/06/2020 Elsevier Patient Education  [place].

## 2022-07-10 NOTE — Progress Notes (Signed)
$'[name]$  ID: Gabriel Carroll, male    DOB: Mar 03, 1966, 56 y.o.   MRN: [id number]  Chief Complaint  Patient presents with   Consult    Referring provider: Pleas Koch, NP  HPI: 56 year old male, never smoked.  Past medical history significant A-fib, sleep apnea, right rotator cuff repair, obesity.  07/10/2022 Patient presents today for sleep consult. Hx sleep apnea and afib. He has symptoms of witnessed apnea and snoring. Snoring has improved with weight loss. No significant daytime. He is not bothered by his sleep.  Typical bedtime is 10 PM.  It takes him on average 5 to 10 minutes to fall asleep.  He wakes up twice a night.  He starts his day at 7 AM.  He had a sleep study in 2009 in [place].  He is not currently on CPAP or oxygen.  He has lost 40 pounds in the last year. Epworth score is 4.  Denies symptoms of narcolepsy, cataplexy or sleepwalking.   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      CELEBREX 100 MG capsule Take 100 mg by mouth 2 (two) times daily.     clomiPHENE (CLOMID) 50 MG tablet Take 50 mg by mouth daily.     ibuprofen (ADVIL,MOTRIN) 200 MG tablet Take 400 mg by mouth daily as needed.     tadalafil (CIALIS) 5 MG tablet      meloxicam (MOBIC) 15 MG tablet Take 15 mg by mouth as needed.     No facility-administered medications prior to visit.   Review of Systems  Review of Systems  Constitutional: Negative.   HENT: Negative.    Respiratory: Negative.    Cardiovascular: Negative.    Physical Exam  BP 122/62 (BP Location: Left Arm, Patient Position: Sitting, Cuff Size: Normal)   Pulse (!) 57   Temp 97.9 F (36.6 C) (Oral)   Ht '5\' 10"'$  (1.778 m)   Wt 204 lb (92.5 kg)   SpO2 99%   BMI 29.27 kg/m  Physical Exam Constitutional:      General: He is not in acute distress.    Appearance: Normal appearance. He is normal weight. He is not ill-appearing.  HENT:     Head: Normocephalic and atraumatic.  Mouth/Throat:     Mouth: Mucous membranes are moist.     Pharynx: Oropharynx is clear.  Cardiovascular:     Rate and Rhythm: Normal rate and regular rhythm.     Comments: RRR Pulmonary:     Effort: Pulmonary effort is normal.     Breath sounds: Normal breath sounds.  Musculoskeletal:        General: Normal range of motion.     Comments: Right shoulder sling   Skin:    General: Skin is warm and dry.  Neurological:     General: No focal deficit present.     Mental Status: He is alert. Mental status is at baseline.  Psychiatric:        Mood and Affect: Mood normal.        Behavior: Behavior normal.        Thought Content: Thought content normal.        Judgment: Judgment normal.      Lab Results:  CBC    Component Value Date/Time   WBC 5.3 03/05/2022 0949    WBC 5.3 02/11/2022 1005   RBC 5.15 03/05/2022 0949   HGB 16.4 03/05/2022 0949   HCT 46.3 03/05/2022 0949   PLT 132 (L) 03/05/2022 0949   MCV 89.9 03/05/2022 0949   MCH 31.8 03/05/2022 0949   MCHC 35.4 03/05/2022 0949   RDW 11.6 03/05/2022 0949   LYMPHSABS 1.6 03/05/2022 0949   MONOABS 0.5 03/05/2022 0949   EOSABS 0.1 03/05/2022 0949   BASOSABS 0.0 03/05/2022 0949    BMET    Component Value Date/Time   NA 139 03/05/2022 0949   K 3.9 03/05/2022 0949   CL 106 03/05/2022 0949   CO2 28 03/05/2022 0949   GLUCOSE 102 (H) 03/05/2022 0949   BUN 13 03/05/2022 0949   CREATININE 0.96 03/05/2022 0949   CALCIUM 9.7 03/05/2022 0949   GFRNONAA >60 03/05/2022 0949   GFRAA >60 09/26/2017 0028    BNP No results found for: "BNP"  ProBNP No results found for: "PROBNP"  Imaging: No results found.   Assessment & Plan:   Witnessed episode of apnea - Patient has symptoms of snoring and witnessed apnea.  He has a history of A-fib.  He had a sleep study in 2009 in [place] that reportedly showed mild sleep apnea.  He is not on CPAP.  He has lost 40 pounds in the last year and snoring has improved.  Recommend getting updated home sleep study to reevaluate for OSA.  Discussed risks of untreated sleep apnea including A-fib, stroke, pulmonary hypertension or diabetes.  Reviewed treatment options including weight loss, oral appliance, CPAP therapy or referral to ENT for possible surgical options.  Advised against driving if experiencing excessive daytime sleepiness or fatigue.  Encourage side sleeping position or elevate head of bed 30 degrees.  Follow-up in 1 to 2 weeks after sleep study to review results and treatment options if needed.      Martyn Ehrich, NP 07/10/2022

## 2022-07-13 DIAGNOSIS — M25611 Stiffness of right shoulder, not elsewhere classified: Secondary | ICD-10-CM | POA: Diagnosis not present

## 2022-07-13 DIAGNOSIS — M75121 Complete rotator cuff tear or rupture of right shoulder, not specified as traumatic: Secondary | ICD-10-CM | POA: Diagnosis not present

## 2022-07-13 DIAGNOSIS — M6281 Muscle weakness (generalized): Secondary | ICD-10-CM | POA: Diagnosis not present

## 2022-07-13 NOTE — Progress Notes (Signed)
Reviewed and agree with assessment/plan.   Chesley Mires, MD [facility] 07/13/2022, 8:50 AM Pager:  [phone]

## 2022-07-14 DIAGNOSIS — M75121 Complete rotator cuff tear or rupture of right shoulder, not specified as traumatic: Secondary | ICD-10-CM | POA: Diagnosis not present

## 2022-07-17 DIAGNOSIS — M25611 Stiffness of right shoulder, not elsewhere classified: Secondary | ICD-10-CM | POA: Diagnosis not present

## 2022-07-17 DIAGNOSIS — M6281 Muscle weakness (generalized): Secondary | ICD-10-CM | POA: Diagnosis not present

## 2022-07-17 DIAGNOSIS — M75121 Complete rotator cuff tear or rupture of right shoulder, not specified as traumatic: Secondary | ICD-10-CM | POA: Diagnosis not present

## 2022-07-20 DIAGNOSIS — M75121 Complete rotator cuff tear or rupture of right shoulder, not specified as traumatic: Secondary | ICD-10-CM | POA: Diagnosis not present

## 2022-07-20 DIAGNOSIS — M6281 Muscle weakness (generalized): Secondary | ICD-10-CM | POA: Diagnosis not present

## 2022-07-20 DIAGNOSIS — M25611 Stiffness of right shoulder, not elsewhere classified: Secondary | ICD-10-CM | POA: Diagnosis not present

## 2022-07-22 DIAGNOSIS — M6281 Muscle weakness (generalized): Secondary | ICD-10-CM | POA: Diagnosis not present

## 2022-07-22 DIAGNOSIS — M25611 Stiffness of right shoulder, not elsewhere classified: Secondary | ICD-10-CM | POA: Diagnosis not present

## 2022-07-22 DIAGNOSIS — M75121 Complete rotator cuff tear or rupture of right shoulder, not specified as traumatic: Secondary | ICD-10-CM | POA: Diagnosis not present

## 2022-07-27 DIAGNOSIS — M75121 Complete rotator cuff tear or rupture of right shoulder, not specified as traumatic: Secondary | ICD-10-CM | POA: Diagnosis not present

## 2022-07-27 DIAGNOSIS — M6281 Muscle weakness (generalized): Secondary | ICD-10-CM | POA: Diagnosis not present

## 2022-07-27 DIAGNOSIS — M25611 Stiffness of right shoulder, not elsewhere classified: Secondary | ICD-10-CM | POA: Diagnosis not present

## 2022-08-03 DIAGNOSIS — L570 Actinic keratosis: Secondary | ICD-10-CM | POA: Diagnosis not present

## 2022-08-03 DIAGNOSIS — L821 Other seborrheic keratosis: Secondary | ICD-10-CM | POA: Diagnosis not present

## 2022-08-03 DIAGNOSIS — M67441 Ganglion, right hand: Secondary | ICD-10-CM | POA: Diagnosis not present

## 2022-08-03 DIAGNOSIS — D225 Melanocytic nevi of trunk: Secondary | ICD-10-CM | POA: Diagnosis not present

## 2022-08-03 DIAGNOSIS — C44619 Basal cell carcinoma of skin of left upper limb, including shoulder: Secondary | ICD-10-CM | POA: Diagnosis not present

## 2022-08-04 DIAGNOSIS — M75121 Complete rotator cuff tear or rupture of right shoulder, not specified as traumatic: Secondary | ICD-10-CM | POA: Diagnosis not present

## 2022-08-04 DIAGNOSIS — R972 Elevated prostate specific antigen [PSA]: Secondary | ICD-10-CM | POA: Diagnosis not present

## 2022-08-04 DIAGNOSIS — M25611 Stiffness of right shoulder, not elsewhere classified: Secondary | ICD-10-CM | POA: Diagnosis not present

## 2022-08-04 DIAGNOSIS — E291 Testicular hypofunction: Secondary | ICD-10-CM | POA: Diagnosis not present

## 2022-08-04 DIAGNOSIS — M6281 Muscle weakness (generalized): Secondary | ICD-10-CM | POA: Diagnosis not present

## 2022-08-05 ENCOUNTER — Encounter: Payer: Self-pay | Admitting: Gastroenterology

## 2022-08-14 DIAGNOSIS — M75121 Complete rotator cuff tear or rupture of right shoulder, not specified as traumatic: Secondary | ICD-10-CM | POA: Diagnosis not present

## 2022-08-14 DIAGNOSIS — M6281 Muscle weakness (generalized): Secondary | ICD-10-CM | POA: Diagnosis not present

## 2022-08-14 DIAGNOSIS — M25611 Stiffness of right shoulder, not elsewhere classified: Secondary | ICD-10-CM | POA: Diagnosis not present

## 2022-08-19 DIAGNOSIS — M6281 Muscle weakness (generalized): Secondary | ICD-10-CM | POA: Diagnosis not present

## 2022-08-19 DIAGNOSIS — M25611 Stiffness of right shoulder, not elsewhere classified: Secondary | ICD-10-CM | POA: Diagnosis not present

## 2022-08-19 DIAGNOSIS — M75121 Complete rotator cuff tear or rupture of right shoulder, not specified as traumatic: Secondary | ICD-10-CM | POA: Diagnosis not present

## 2022-08-27 DIAGNOSIS — M75121 Complete rotator cuff tear or rupture of right shoulder, not specified as traumatic: Secondary | ICD-10-CM | POA: Diagnosis not present

## 2022-08-27 DIAGNOSIS — M6281 Muscle weakness (generalized): Secondary | ICD-10-CM | POA: Diagnosis not present

## 2022-08-27 DIAGNOSIS — M25611 Stiffness of right shoulder, not elsewhere classified: Secondary | ICD-10-CM | POA: Diagnosis not present

## 2022-09-01 DIAGNOSIS — M75121 Complete rotator cuff tear or rupture of right shoulder, not specified as traumatic: Secondary | ICD-10-CM | POA: Diagnosis not present

## 2022-09-01 DIAGNOSIS — M6281 Muscle weakness (generalized): Secondary | ICD-10-CM | POA: Diagnosis not present

## 2022-09-01 DIAGNOSIS — M25611 Stiffness of right shoulder, not elsewhere classified: Secondary | ICD-10-CM | POA: Diagnosis not present

## 2022-09-08 DIAGNOSIS — M25611 Stiffness of right shoulder, not elsewhere classified: Secondary | ICD-10-CM | POA: Diagnosis not present

## 2022-09-08 DIAGNOSIS — M75121 Complete rotator cuff tear or rupture of right shoulder, not specified as traumatic: Secondary | ICD-10-CM | POA: Diagnosis not present

## 2022-09-08 DIAGNOSIS — M6281 Muscle weakness (generalized): Secondary | ICD-10-CM | POA: Diagnosis not present

## 2022-09-18 DIAGNOSIS — M6281 Muscle weakness (generalized): Secondary | ICD-10-CM | POA: Diagnosis not present

## 2022-09-18 DIAGNOSIS — M25611 Stiffness of right shoulder, not elsewhere classified: Secondary | ICD-10-CM | POA: Diagnosis not present

## 2022-09-18 DIAGNOSIS — M75121 Complete rotator cuff tear or rupture of right shoulder, not specified as traumatic: Secondary | ICD-10-CM | POA: Diagnosis not present

## 2022-09-24 ENCOUNTER — Ambulatory Visit (INDEPENDENT_AMBULATORY_CARE_PROVIDER_SITE_OTHER): Payer: BC Managed Care – PPO | Admitting: Gastroenterology

## 2022-09-24 ENCOUNTER — Encounter: Payer: Self-pay | Admitting: Gastroenterology

## 2022-09-24 ENCOUNTER — Other Ambulatory Visit (INDEPENDENT_AMBULATORY_CARE_PROVIDER_SITE_OTHER): Payer: BC Managed Care – PPO

## 2022-09-24 VITALS — BP 128/76 | HR 61 | Ht 70.0 in | Wt 215.0 lb

## 2022-09-24 DIAGNOSIS — K760 Fatty (change of) liver, not elsewhere classified: Secondary | ICD-10-CM | POA: Diagnosis not present

## 2022-09-24 DIAGNOSIS — R161 Splenomegaly, not elsewhere classified: Secondary | ICD-10-CM | POA: Diagnosis not present

## 2022-09-24 DIAGNOSIS — K7460 Unspecified cirrhosis of liver: Secondary | ICD-10-CM

## 2022-09-24 LAB — CBC WITH DIFFERENTIAL/PLATELET
Basophils Absolute: 0 10*3/uL (ref 0.0–0.1)
Basophils Relative: 0.9 % (ref 0.0–3.0)
Eosinophils Absolute: 0.2 10*3/uL (ref 0.0–0.7)
Eosinophils Relative: 4.2 % (ref 0.0–5.0)
HCT: 44.1 % (ref 39.0–52.0)
Hemoglobin: 15.2 g/dL (ref 13.0–17.0)
Lymphocytes Relative: 32.2 % (ref 12.0–46.0)
Lymphs Abs: 1.6 10*3/uL (ref 0.7–4.0)
MCHC: 34.3 g/dL (ref 30.0–36.0)
MCV: 90.7 fl (ref 78.0–100.0)
Monocytes Absolute: 0.5 10*3/uL (ref 0.1–1.0)
Monocytes Relative: 9.4 % (ref 3.0–12.0)
Neutro Abs: 2.7 10*3/uL (ref 1.4–7.7)
Neutrophils Relative %: 53.3 % (ref 43.0–77.0)
Platelets: 130 10*3/uL — ABNORMAL LOW (ref 150.0–400.0)
RBC: 4.86 Mil/uL (ref 4.22–5.81)
RDW: 12.4 % (ref 11.5–15.5)
WBC: 5 10*3/uL (ref 4.0–10.5)

## 2022-09-24 LAB — COMPREHENSIVE METABOLIC PANEL
ALT: 20 U/L (ref 0–53)
AST: 21 U/L (ref 0–37)
Albumin: 4.6 g/dL (ref 3.5–5.2)
Alkaline Phosphatase: 81 U/L (ref 39–117)
BUN: 21 mg/dL (ref 6–23)
CO2: 30 mEq/L (ref 19–32)
Calcium: 9.6 mg/dL (ref 8.4–10.5)
Chloride: 104 mEq/L (ref 96–112)
Creatinine, Ser: 0.92 mg/dL (ref 0.40–1.50)
GFR: 92.06 mL/min (ref >60.00)
Glucose, Bld: 92 mg/dL (ref 70–99)
Potassium: 4.2 mEq/L (ref 3.5–5.1)
Sodium: 139 mEq/L (ref 135–145)
Total Bilirubin: 0.8 mg/dL (ref 0.2–1.2)
Total Protein: 7.2 g/dL (ref 6.0–8.3)

## 2022-09-24 LAB — PROTIME-INR
INR: 1.1 ratio — ABNORMAL HIGH (ref 0.8–1.0)
Prothrombin Time: 12.2 s (ref 9.6–13.1)

## 2022-09-24 NOTE — Patient Instructions (Signed)
_______________________________________________________  If you are age 57 or older, your body mass index should be between 23-30. Your Body mass index is 30.85 kg/m. If this is out of the aforementioned range listed, please consider follow up with your Primary Care Provider.  If you are age 59 or younger, your body mass index should be between 19-25. Your Body mass index is 30.85 kg/m. If this is out of the aformentioned range listed, please consider follow up with your Primary Care Provider.   ________________________________________________________  The [facility] providers would like to encourage you to use [facility] to communicate with providers for non-urgent requests or questions.  Due to long hold times on the telephone, sending your provider a message by [facility] may be a faster and more efficient way to get a response.  Please allow 48 business hours for a response.  Please remember that this is for non-urgent requests.  _______________________________________________________  Your provider has requested that you go to the basement level for lab work before leaving today. Press "B" on the elevator. The lab is located at the first door on the left as you exit the elevator.  You have been scheduled for an abdominal ultrasound at [facility] (1st floor of hospital) on 09-29-2022 at 9:30am. Please arrive 30 minutes prior to your appointment for registration. Make certain not to have anything to eat or drink 6 hours prior to your appointment. Should you need to reschedule your appointment, please contact radiology at [phone]. This test typically takes about 30 minutes to perform.   It was a pleasure to see you today!  Thank you for trusting me with your gastrointestinal care!

## 2022-09-24 NOTE — Progress Notes (Signed)
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  Clinical details in that note.  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  (I did discuss this with a hepatology colleague since his last visit with me) He has portal hypertension with splenomegaly and thrombocytopenia.  MELD = 6, no ascites or hepatic encephalopathy.  No esophageal or gastric varices on screening EGD July 2023.  Gabriel Carroll is feeling well these days, and says he is doing his best to follow a low carbohydrate diet, though he has gained some weight since I last saw him.  He does not drink alcohol, does not smoke and does his best to get regular exercise. He was concerned a couple months ago because an employment physical including lab work that showed his platelets were lower than before at 108,000.  ROS: Cardiovascular:  no chest pain Respiratory: no dyspnea  The patient's Past Medical, Family and Social History were reviewed and are on file in the EMR.  Objective:  Med list reviewed  Current Outpatient Medications:    clomiPHENE (CLOMID) 50 MG tablet, Take 50 mg by mouth daily., Disp: , Rfl:    tadalafil (CIALIS) 5 MG tablet, , Disp: , Rfl:    acetaminophen (TYLENOL) 500 MG tablet, Take 500 mg by mouth every 6 (six) hours as needed for mild pain, moderate pain or headache. , Disp: , Rfl:    CELEBREX 100 MG capsule, Take 100 mg by mouth 2 (two) times daily. (Patient not taking: Reported on 09/24/2022), Disp: , Rfl:    ibuprofen (ADVIL,MOTRIN) 200 MG tablet, Take 400 mg by mouth daily as needed., Disp: , Rfl:    Vital signs in last 24 hrs: Vitals:   09/24/22 1400  BP: 128/76  Pulse: 61   Wt Readings from Last 3 Encounters:  09/24/22 215 lb (97.5 kg)  07/10/22 204 lb  (92.5 kg)  06/24/22 206 lb 11.2 oz (93.8 kg)    Physical Exam  Well-appearing HEENT: sclera anicteric, oral mucosa moist without lesions Cardiac: Regular without murmur,  no peripheral edema Pulm: clear to auscultation bilaterally, normal RR and effort noted Abdomen: soft, no tenderness, with active bowel sounds.  Left lobe liver palpable, no spleen tip palpable.  Abdomen soft nondistended with no bulging flanks and no bruit Skin; warm and dry, no jaundice or rash  Labs:     Latest Ref Rng & Units 03/05/2022    9:49 AM 02/11/2022   10:05 AM 09/26/2017   12:28 AM  CBC  WBC 4.0 - 10.5 K/uL 5.3  5.3  9.7   Hemoglobin 13.0 - 17.0 g/dL 16.4  16.3  15.9   Hematocrit 39.0 - 52.0 % 46.3  48.3  44.6   Platelets 150 - 400 K/uL 132  118.0  133       Latest Ref Rng & Units 03/05/2022    9:49 AM 02/11/2022   10:05 AM 04/20/2018    9:23 AM  CMP  Glucose 70 - 99 mg/dL 102  93  101   BUN 6 - 20 mg/dL '13  16  20   '$ Creatinine 0.61 - 1.24 mg/dL 0.96  1.09  0.90   Sodium 135 - 145 mmol/L 139  138  140   Potassium 3.5 -  5.1 mmol/L 3.9  4.2  4.6   Chloride 98 - 111 mmol/L 106  104  103   CO2 22 - 32 mmol/L 28  29  32   Calcium 8.9 - 10.3 mg/dL 9.7  9.7  9.7   Total Protein 6.5 - 8.1 g/dL 7.4  7.5  7.1   Total Bilirubin 0.3 - 1.2 mg/dL 0.7  0.7  0.6   Alkaline Phos 38 - 126 U/L 71  69  76   AST 15 - 41 U/L '21  24  25   '$ ALT 0 - 44 U/L '19  22  28    '$ Lab Results  Component Value Date   INR 1.0 04/16/2022   INR 1.01 09/26/2017   INR 1.07 05/19/2015   Last AFP normal at 1.18 April 2022   ___________________________________________ Radiologic studies:   ____________________________________________ Other:   _____________________________________________ Assessment & Plan  Assessment: Encounter Diagnoses  Name Primary?   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  Hepatology colleague did  not think that his portal hypertension was likely related to prior surgery as a living related liver donor.  Low MEL D score, compensated without complications. I reassured him that we will fluctuate, and the long-term trend is most important thing.  Plan: Labs today: CBC, CMP, INR, AFP  Right upper quadrant ultrasound for Walker screening.  See me in 6 months or sooner if needed.  Make best efforts at low carbohydrate diet (he did meet with nutritionist) and regular exercise for weight loss.   Nelida Meuse III

## 2022-09-25 DIAGNOSIS — M75121 Complete rotator cuff tear or rupture of right shoulder, not specified as traumatic: Secondary | ICD-10-CM | POA: Diagnosis not present

## 2022-09-25 DIAGNOSIS — M6281 Muscle weakness (generalized): Secondary | ICD-10-CM | POA: Diagnosis not present

## 2022-09-25 DIAGNOSIS — M25611 Stiffness of right shoulder, not elsewhere classified: Secondary | ICD-10-CM | POA: Diagnosis not present

## 2022-09-28 ENCOUNTER — Encounter: Payer: Self-pay | Admitting: Family Medicine

## 2022-09-28 ENCOUNTER — Ambulatory Visit (INDEPENDENT_AMBULATORY_CARE_PROVIDER_SITE_OTHER): Payer: BC Managed Care – PPO | Admitting: Family Medicine

## 2022-09-28 ENCOUNTER — Encounter: Payer: Self-pay | Admitting: *Deleted

## 2022-09-28 VITALS — BP 100/60 | HR 59 | Temp 98.0°F | Ht 70.0 in | Wt 216.4 lb

## 2022-09-28 DIAGNOSIS — K7460 Unspecified cirrhosis of liver: Secondary | ICD-10-CM

## 2022-09-28 DIAGNOSIS — R200 Anesthesia of skin: Secondary | ICD-10-CM | POA: Diagnosis not present

## 2022-09-28 DIAGNOSIS — R2990 Unspecified symptoms and signs involving the nervous system: Secondary | ICD-10-CM | POA: Diagnosis not present

## 2022-09-28 DIAGNOSIS — R202 Paresthesia of skin: Secondary | ICD-10-CM | POA: Diagnosis not present

## 2022-09-28 LAB — AFP TUMOR MARKER: AFP-Tumor Marker: 1.2 ng/mL (ref <6.1)

## 2022-09-28 NOTE — Progress Notes (Signed)
Aneli Zara T. Sidonia Nutter, MD, [place] at [facility] [place], [address]  Phone: [phone]  FAX: [phone]  Gabriel Carroll - 56 y.o. male  MRN [id number]  Date of Birth: 09-20-1966  Date: 09/28/2022  PCP: Pleas Koch, NP  Referral: Pleas Koch, NP  Chief Complaint  Patient presents with   Sensation of Drooling on Right Side    Subjective:   Gabriel Carroll is a 56 y.o. very pleasant male patient with Body mass index is 31.05 kg/m. who presents with the following:  I am asked to work this patient in 3-4 months of drooling sensation:   He is a pleasant gentleman with a history of cirrhosis and paroxysmal A-fib who presents with a 3 to 38-monthhistory of a wet face, some decrease sensation and an overall abnormal sensation on the right side of his face and cheek.  He feels as if his feet as well as on the corner of his lips, and as if he is drooling, however when he checks he is not actually drooling and has no wet liquid in or about his face at all.  Feels like it was there before his shoulder surgery that he had in September.   He denies double vision, blurred vision, dizziness, any sort of vertigo sensation, slurred speech, difficulty with word finding, lack of mental focus, weakness, other abnormal sensations aside from the face, trouble walking, balance disturbance, or any other symptoms including changes in taste or smell.  Has been on clomid for several years for low testosterone.   SEND NOTE TO REFERRALS - TRY TO DO THIS YEAR  Review of Systems is noted in the HPI, as appropriate  Patient Active Problem List   Diagnosis Date Noted   Cirrhosis of liver ([facility] 09/28/2022   Acquired hallux valgus 08/05/2018   Low serum testosterone 05/20/2015   Paroxysmal atrial fibrillation ([facility]    Exotropia 12/05/2013   Hypertropia 12/05/2013    Past Medical History:  Diagnosis Date   Arthritis    OA left  shoulder   Atrial fibrillation ([facility]    Closed displaced fracture of base of fifth metacarpal bone of right hand with routine healing 08/20/2017   COVID-19 virus infection 05/02/2020   Derangement of posterior horn of lateral meniscus 08/05/2018   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   Low testosterone    Obesity (BMI 30-39.9)    Pleural effusion    Procedure and treatment not carried out because of patient's decision for other reasons 08/04/2012   Sleep apnea    no CPAP    Past Surgical History:  Procedure Laterality Date   BUNIONECTOMY     CHOLECYSTECTOMY     foot sugery  08/2020   HARDWARE REMOVAL Left 08/22/2020   Procedure: Left foot 1st metatarsal removal of deep implant;  Surgeon: HWylene Simmer MD;  [place] [facility]  Service: Orthopedics;  Laterality: Left;  742m   KNEE CARTILAGE SURGERY     lt   LIGAMENT REPAIR  08/22/2020   Procedure: LIGAMENT REPAIR;  Surgeon: HeWylene SimmerMD;  [place] [facility] Service: Orthopedics;;   liver doner     lung surgey     METATARSAL OSTEOTOMY  08/22/2020   Procedure: METATARSAL OSTEOTOMY;  Surgeon: HeWylene SimmerMD;  [place] Service: Orthopedics;;   shoulder surgery rt     WEIL OSTEOTOMY Left 08/22/2020   Procedure:  1st metatarsal Scarf osteotomy; 2nd metatarsal Weil osteotomy; repair of lateral collateral ligament;  Surgeon: Wylene Simmer, MD;  Location: [place];  Service: Orthopedics;  Laterality: Left;    Family History  Problem Relation Age of Onset   Hypertension Mother    Arthritis Mother    Hypertension Father    Cancer Father    Heart disease Father    Diabetes Paternal Aunt    Diabetes Paternal Uncle    Diabetes Sister    Diabetes Brother    Asthma Maternal Grandmother    Heart disease Maternal Grandfather    Diabetes Paternal Grandmother    Colon cancer Neg Hx    Esophageal cancer Neg Hx    Rectal cancer Neg Hx    Stomach  cancer Neg Hx     Social History   Social History Narrative   Married.   2 children.   Works as a Scientist, forensic.   Enjoys mountain biking, brewing beers, hiking.      Objective:   BP 100/60   Pulse (!) 59   Temp 98 F (36.7 C) (Oral)   Ht '5\' 10"'$  (1.778 m)   Wt 216 lb 6 oz (98.1 kg)   SpO2 96%   BMI 31.05 kg/m   GEN: No acute distress; alert,appropriate. PULM: Breathing comfortably in no respiratory distress PSYCH: Normally interactive.  CV: RRR, no m/g/r  PULM: Normal respiratory rate, no accessory muscle use. No wheezes, crackles or rhonchi   Neuro: CN 2-12 grossly intact with the exception of right face near the corner of the mouth and adjacent to this area having decreased sensation to soft touch.   PERRLA. EOMI. Sensation intact throughout. Str 5/5 all extremities. DTR 2+. No clonus. A and o x 4. Romberg neg. Finger nose neg. Heel toe walking normal.  PSYCH: Normally interactive. Conversant. Not depressed or anxious appearing.  Calm demeanor.    Laboratory and Imaging Data: Recent Results (from the past 2160 hour(s))  Protime-INR     Status: Abnormal   Collection Time: 09/24/22  2:43 PM  Result Value Ref Range   INR 1.1 (H) 0.8 - 1.0 ratio   Prothrombin Time 12.2 9.6 - 13.1 sec  Comprehensive metabolic panel     Status: None   Collection Time: 09/24/22  2:43 PM  Result Value Ref Range   Sodium 139 135 - 145 mEq/L   Potassium 4.2 3.5 - 5.1 mEq/L   Chloride 104 96 - 112 mEq/L   CO2 30 19 - 32 mEq/L   Glucose, Bld 92 70 - 99 mg/dL   BUN 21 6 - 23 mg/dL   Creatinine, Ser 0.92 0.40 - 1.50 mg/dL   Total Bilirubin 0.8 0.2 - 1.2 mg/dL   Alkaline Phosphatase 81 39 - 117 U/L   AST 21 0 - 37 U/L   ALT 20 0 - 53 U/L   Total Protein 7.2 6.0 - 8.3 g/dL   Albumin 4.6 3.5 - 5.2 g/dL   GFR 92.06 >60.00 mL/min    Comment: Calculated using the CKD-EPI Creatinine Equation (2021)   Calcium 9.6 8.4 - 10.5 mg/dL  CBC with  Differential/Platelet     Status: Abnormal   Collection Time: 09/24/22  2:43 PM  Result Value Ref Range   WBC 5.0 4.0 - 10.5 K/uL   RBC 4.86 4.22 - 5.81 Mil/uL   Hemoglobin 15.2 13.0 - 17.0 g/dL   HCT 44.1 39.0 - 52.0 %  MCV 90.7 78.0 - 100.0 fl   MCHC 34.3 30.0 - 36.0 g/dL   RDW 12.4 11.5 - 15.5 %   Platelets 130.0 (L) 150.0 - 400.0 K/uL   Neutrophils Relative % 53.3 43.0 - 77.0 %   Lymphocytes Relative 32.2 12.0 - 46.0 %   Monocytes Relative 9.4 3.0 - 12.0 %   Eosinophils Relative 4.2 0.0 - 5.0 %   Basophils Relative 0.9 0.0 - 3.0 %   Neutro Abs 2.7 1.4 - 7.7 K/uL   Lymphs Abs 1.6 0.7 - 4.0 K/uL   Monocytes Absolute 0.5 0.1 - 1.0 K/uL   Eosinophils Absolute 0.2 0.0 - 0.7 K/uL   Basophils Absolute 0.0 0.0 - 0.1 K/uL     Assessment and Plan:     ICD-10-CM   1. Rt facial numbness  R20.0 MR Brain W Wo Contrast    2. Abnormal neurological exam  R29.90 MR Brain W Wo Contrast    3. Facial tingling  R20.2 MR Brain W Wo Contrast    4. Cirrhosis of liver without ascites, unspecified hepatic cirrhosis type (HCC)  K74.60      Abnormal facial numbness, decreased sensation, and a sensation that is similar to tingling on the right side of his face.  Abnormal neurological exam showing such.  Obtain an MRI of the brain with and without contrast to evaluate for demyelinating disease and neoplasm.  Medications Discontinued During This Encounter  Medication Reason   acetaminophen (TYLENOL) 500 MG tablet Completed Course   ibuprofen (ADVIL,MOTRIN) 200 MG tablet Completed Course   CELEBREX 100 MG capsule Completed Course    Orders placed today for conditions managed today: Orders Placed This Encounter  Procedures   MR Brain W Wo Contrast    Disposition: No follow-ups on file.  Dragon [employer] One speech-to-text software was used for transcription in this dictation.  Possible transcriptional errors can occur using Editor, commissioning.   Signed,  Maud Deed. Percilla Tweten,  MD   Outpatient Encounter Medications as of 09/28/2022  Medication Sig   clomiPHENE (CLOMID) 50 MG tablet Take 50 mg by mouth daily.   tadalafil (CIALIS) 5 MG tablet    [DISCONTINUED] acetaminophen (TYLENOL) 500 MG tablet Take 500 mg by mouth every 6 (six) hours as needed for mild pain, moderate pain or headache.    [DISCONTINUED] CELEBREX 100 MG capsule Take 100 mg by mouth 2 (two) times daily. (Patient not taking: Reported on 09/24/2022)   [DISCONTINUED] ibuprofen (ADVIL,MOTRIN) 200 MG tablet Take 400 mg by mouth daily as needed.   No facility-administered encounter medications on file as of 09/28/2022.

## 2022-09-28 NOTE — Telephone Encounter (Signed)
Patient called to the office and he was sent to access nurse.

## 2022-09-28 NOTE — Telephone Encounter (Signed)
[place] nurse with access nurse said that pt is drooling but no numbness or weakness in face and pt is not having any CP. No available appts at [facility] or [facility]. Hilda Blades said pt was advised by access disposition to go to UC or ED in the next hr.  I spoke with pt to see where he was going and pt said has had drooling for 1 month with no other symptoms; no numbness, no pain, no change in facial expressions. No H/A or CP. Pt said he does not want to go to UC and wants next available appt at [facility]. Dr Lorelei Pont had cancellation 09/28/22 at 11:40 and pt took that appt with UC & ED precautions and pt voiced understanding. Sending note to Dr Lorelei Pont and [place] pool.    [place] Day - Client TELEPHONE ADVICE RECORD AccessNurse Patient Name: Gabriel Carroll Gender: Male DOB: 1966/08/20 Age: 56 Y 11 M 13 D Return Phone Number: [phone] (Primary) Address: City/ State/ Zip: [place]  [address] Client [place] Day - Client Client Site [place] - Day Provider Alma Friendly - NP Contact Type Call Who Is Calling Patient / Member / Family / Caregiver Call Type Triage / Clinical Relationship To Patient Self Return Phone Number [phone] (Primary) Chief Complaint Unclassified Symptom Reason for Call Symptomatic / Request for [place] states he has been feeling the right side of his face feels like he is drooling and he is always wiping it. Caller states hes not drolling. No numbness. Translation No Nurse Assessment Nurse: Tito Dine, RN, Neoma Laming Date/Time Eilene Ghazi Time): 09/28/2022 9:28:53 AM Confirm and document reason for call. If symptomatic, describe symptoms. ---Caller states he has been feeling the right side of his face feels like he is drooling and he is always wiping it. Caller states he's not drooling. Denies numbness. Caller states his face looks  normal. Does the patient have any new or worsening symptoms? ---Yes Will a triage be completed? ---Yes Related visit to physician within the last 2 weeks? ---No Does the PT have any chronic conditions? (i.e. diabetes, asthma, this includes High risk factors for pregnancy, etc.) ---No Is this a behavioral health or substance abuse call? ---No Guidelines Guideline Title Affirmed Question Affirmed Notes Nurse Date/Time (Clara Time) Neurologic Deficit Patient sounds very sick or weak to the triager Tito Dine, RN, Neoma Laming 09/28/2022 9:31:43 AM Disp. Time Eilene Ghazi Time) Disposition Final User 09/28/2022 9:42:06 AM Go to ED Now (or PCP triage) Yes Tito Dine, RN, Deborah Final Disposition 09/28/2022 9:42:06 AM Go to ED Now (or PCP triage) Yes Tito Dine, RN, Deborah PLEASE NOTE: All timestamps contained within this report are represented as [place] Standard Time. CONFIDENTIALTY NOTICE: This fax transmission is intended only for the addressee. It contains information that is legally privileged, confidential or otherwise protected from use or disclosure. If you are not the intended recipient, you are strictly prohibited from reviewing, disclosing, copying using or disseminating any of this information or taking any action in reliance on or regarding this information. If you have received this fax in error, please notify us immediately by telephone so that we can arrange for its return to [place]. Phone: [phone], Toll-Free: [phone], Fax: [phone] Page: 2 of 2 Call Id: [id number] [place] Disagree/Comply Disagree Caller Understands No PreDisposition Call Doctor Care Advice Given Per Guideline GO TO ED NOW (OR PCP TRIAGE): * IF NO PCP (PRIMARY CARE PROVIDER) SECOND-LEVEL TRIAGE: You need  to be seen within the next hour. Go to the [facility] at _____________ [place] as soon as you can. * It is better and safer if another adult drives instead of you. CARE  ADVICE given per Neurologic Deficit (Adult) guideline. ANOTHER ADULT SHOULD DRIVE: Comments User: Nelwyn Salisbury, RN Date/Time Eilene Ghazi Time): 09/28/2022 9:45:57 AM Called back line to inform that patient refused to go the [facility]/ER and wanted to be seen at the office instead. Office staff stated that no appt was available for today or tomorrow and that he should follow RN recommendations. Called patient back, inform pt about office staff instructions, pt verbalized understanding, Referrals GO TO [place]

## 2022-09-29 ENCOUNTER — Ambulatory Visit (HOSPITAL_COMMUNITY)
Admission: RE | Admit: 2022-09-29 | Discharge: 2022-09-29 | Disposition: A | Discharge status: Home / Self Care | Payer: BC Managed Care – PPO | Source: Ambulatory Visit | Attending: Gastroenterology | Admitting: Gastroenterology

## 2022-09-29 DIAGNOSIS — K760 Fatty (change of) liver, not elsewhere classified: Secondary | ICD-10-CM | POA: Insufficient documentation

## 2022-09-29 DIAGNOSIS — K7460 Unspecified cirrhosis of liver: Secondary | ICD-10-CM | POA: Diagnosis not present

## 2022-09-29 DIAGNOSIS — R161 Splenomegaly, not elsewhere classified: Secondary | ICD-10-CM | POA: Insufficient documentation

## 2022-09-29 DIAGNOSIS — R945 Abnormal results of liver function studies: Secondary | ICD-10-CM | POA: Diagnosis not present

## 2022-09-30 ENCOUNTER — Encounter: Payer: BC Managed Care – PPO | Attending: Gastroenterology | Admitting: Dietician

## 2022-09-30 VITALS — Ht 70.0 in | Wt 213.0 lb

## 2022-09-30 DIAGNOSIS — K760 Fatty (change of) liver, not elsewhere classified: Secondary | ICD-10-CM | POA: Insufficient documentation

## 2022-09-30 NOTE — Progress Notes (Signed)
Medical Nutrition Therapy  Appointment Start time:  0800  Appointment End time:  0822   Primary concerns today: Pt wants to know what foods to limit and overall nutrition with fatty liver.     Referral diagnosis: fatty liver Preferred learning style: no preference indicated Learning readiness: ready     NUTRITION ASSESSMENT    Anthropometrics  Ht: 70 in Wt: 206 lbs Wt 09/30/22: 213 lbs   Clinical Medical Hx: arthritis, sleep apnea, fatty liver Medications: reviewed Labs: reviewed Notable Signs/Symptoms: none   Lifestyle & Dietary Hx  Pt had rotator cuff surgery in September and feels that the surgery made him inactive and get off track with exercise.   Pt states he has been craving sweets and feels that he is eating too much fruit.  Pt states when he eats a snack he will get a craving for it again shortly after.  He states he has been eating oranges, apples and dates. He will eat 6 dates and then an hour later wants something sweet again.   Pt states he only eats red meat once per week.    Estimated daily fluid intake: 64+ oz Supplements: none Sleep: 7-8 hours, no issues Stress / self-care: mild stress Current average weekly physical activity: Pt walks dog every day and does peleton/gym a few days a week.    24-Hr Dietary Recall First Meal: banana and cheerios OR banana and oats with blueberries and walnuts Snack: fruit OR dates Second Meal: healthy choice simply steamer with siggis yogurt or fruit OR out to eat (salad with grilled shrimp) Snack: fruit OR cashews OR hummus and chips Third Meal: eats out 1x/wk OR cooks chicken or salmon with salad OR mythos grill greek salads Snack: yasso yogurt bar OR grapes Beverages: coffee with 1 tsp sugar, water, sparkling water, green tea with turmeric. Hasn't been drinking alcohol since June.    NUTRITION DIAGNOSIS  NB-1.1 Food and nutrition-related knowledge deficit As related to non-alcoholic fatty liver disease.  As  evidenced by pt report and diet history.     NUTRITION INTERVENTION  Nutrition education (E-1) on the following topics:  Benefits of balancing snacks with a carbohydrate and protein Sugar alcohols Choosing whole grains vs refined grains Importance of adequate physical activity   Handouts Provided Include  Snack sheet   Learning Style & Readiness for Change Teaching method utilized: Visual & Auditory  Demonstrated degree of understanding via: Teach Back  Barriers to learning/adherence to lifestyle change: none   Goals Established by Pt limiting your intake of fats, which are high in calories. replacing saturated fats and trans fats in your diet with unsaturated fats, especially omega-3 fatty acids, which may reduce your chance of heart disease if you have NAFLD. Foods higher in omega 3's - fatty fish, hemp seeds, chia seeds, flax seeds, walnuts Choose whole grain or whole wheat over refined grains.  If you have NAFLD, you should minimize alcohol use, which can further damage your liver. When snacking, pair a carbohydrate with a protein.      MONITORING & EVALUATION Dietary intake, weekly physical activity, and follow up in 6 months.   Next Steps  Patient is to call for questions.

## 2022-10-02 DIAGNOSIS — M6281 Muscle weakness (generalized): Secondary | ICD-10-CM | POA: Diagnosis not present

## 2022-10-02 DIAGNOSIS — M75121 Complete rotator cuff tear or rupture of right shoulder, not specified as traumatic: Secondary | ICD-10-CM | POA: Diagnosis not present

## 2022-10-02 DIAGNOSIS — M25611 Stiffness of right shoulder, not elsewhere classified: Secondary | ICD-10-CM | POA: Diagnosis not present

## 2022-10-07 DIAGNOSIS — M75121 Complete rotator cuff tear or rupture of right shoulder, not specified as traumatic: Secondary | ICD-10-CM | POA: Diagnosis not present

## 2022-10-07 DIAGNOSIS — M25611 Stiffness of right shoulder, not elsewhere classified: Secondary | ICD-10-CM | POA: Diagnosis not present

## 2022-10-07 DIAGNOSIS — M6281 Muscle weakness (generalized): Secondary | ICD-10-CM | POA: Diagnosis not present

## 2022-10-08 ENCOUNTER — Ambulatory Visit
Admission: RE | Admit: 2022-10-08 | Discharge: 2022-10-08 | Disposition: A | Discharge status: Home / Self Care | Payer: BC Managed Care – PPO | Source: Ambulatory Visit | Attending: Family Medicine | Admitting: Family Medicine

## 2022-10-08 DIAGNOSIS — R202 Paresthesia of skin: Secondary | ICD-10-CM | POA: Diagnosis not present

## 2022-10-08 DIAGNOSIS — R2990 Unspecified symptoms and signs involving the nervous system: Secondary | ICD-10-CM

## 2022-10-08 DIAGNOSIS — R200 Anesthesia of skin: Secondary | ICD-10-CM

## 2022-10-08 MED ORDER — GADOPICLENOL 0.5 MMOL/ML IV SOLN
10.0000 mL | Freq: Once | INTRAVENOUS | Status: AC | PRN
  Administered 2022-10-08: 10 mL via INTRAVENOUS

## 2022-10-13 ENCOUNTER — Ambulatory Visit: Payer: BC Managed Care – PPO

## 2022-10-13 DIAGNOSIS — R0681 Apnea, not elsewhere classified: Secondary | ICD-10-CM

## 2022-10-13 DIAGNOSIS — G4733 Obstructive sleep apnea (adult) (pediatric): Secondary | ICD-10-CM

## 2022-10-20 ENCOUNTER — Telehealth: Payer: Self-pay | Admitting: Pulmonary Disease

## 2022-10-20 DIAGNOSIS — G4733 Obstructive sleep apnea (adult) (pediatric): Secondary | ICD-10-CM | POA: Diagnosis not present

## 2022-10-20 NOTE — Telephone Encounter (Signed)
Call patient  Sleep study result  Date of study: 10/13/2022  Impression: Severe obstructive sleep apnea Moderate oxygen desaturations  Recommendation: DME referral  Recommend CPAP therapy for severe obstructive sleep apnea  Auto titrating CPAP with pressure settings of 5-20 will be appropriate  Encourage weight loss measures  Follow-up in the office 4 to 6 weeks following initiation of treatment

## 2022-10-21 NOTE — Telephone Encounter (Signed)
We review at follow-up on 10/27/22

## 2022-10-27 ENCOUNTER — Ambulatory Visit (INDEPENDENT_AMBULATORY_CARE_PROVIDER_SITE_OTHER): Payer: BC Managed Care – PPO | Admitting: Primary Care

## 2022-10-27 ENCOUNTER — Other Ambulatory Visit: Payer: BC Managed Care – PPO

## 2022-10-27 ENCOUNTER — Encounter: Payer: Self-pay | Admitting: Primary Care

## 2022-10-27 VITALS — BP 118/66 | HR 64 | Ht 70.0 in | Wt 220.0 lb

## 2022-10-27 DIAGNOSIS — G4730 Sleep apnea, unspecified: Secondary | ICD-10-CM | POA: Diagnosis not present

## 2022-10-27 DIAGNOSIS — I480 Paroxysmal atrial fibrillation: Secondary | ICD-10-CM | POA: Diagnosis not present

## 2022-10-27 NOTE — Assessment & Plan Note (Signed)
-  Regular rate and rhythm on exam - No recent afb - Not on anticoagulation or rate controlling medication

## 2022-10-27 NOTE — Progress Notes (Signed)
$'@Patient'F$  ID: Auburn Bilberry, male    DOB: July 03, 1966, 57 y.o.   MRN: [id number]  Chief Complaint  Patient presents with   Follow-up    HST: 10/13/2022     Referring provider: Pleas Koch, NP  HPI: 57 year old male, never smoked.  Past medical history significant A-fib, sleep apnea, right rotator cuff repair, obesity.  Previous [facility] pulmonary encounter: 07/10/2022 Patient presents today for sleep consult. Hx sleep apnea and afib. He has symptoms of witnessed apnea and snoring. Snoring has improved with weight loss. No significant daytime. He is not bothered by his sleep.  Typical bedtime is 10 PM.  It takes him on average 5 to 10 minutes to fall asleep.  He wakes up twice a night.  He starts his day at 7 AM.  He had a sleep study in 2009 in [place].  He is not currently on CPAP or oxygen.  He has lost 40 pounds in the last year. Epworth score is 4.  Denies symptoms of narcolepsy, cataplexy or sleepwalking.  10/27/2022 Sleep study showed severe OSA, AHI 34.2/hr with SpO2 low 83% (average 93%)  Reviewed risks of untreated sleep apnea and treatment options  He does not think he would be able to comfortably wear CPAP. He is interested in oral appliance  Its been several years since he has had an episode of afib.  He is sleeping well. No daytime sleepiness.   Allergies  Allergen Reactions   Vancomycin Other (See Comments)    Red man syndrome    Immunization History  Administered Date(s) Administered   PFIZER(Purple Top)SARS-COV-2 Vaccination 01/27/2020, 02/19/2020   Tdap 09/28/2017   Zoster Recombinat (Shingrix) 02/11/2022, 06/02/2022    Past Medical History:  Diagnosis Date   Arthritis    OA left shoulder   Atrial fibrillation (HCC)    Closed displaced fracture of base of fifth metacarpal bone of right hand with routine healing 08/20/2017   COVID-19 virus infection 05/02/2020   Derangement of posterior horn of lateral meniscus 08/05/2018   Fourth nerve palsy of  right eye 12/05/2013   Low testosterone    Obesity (BMI 30-39.9)    Pleural effusion    Procedure and treatment not carried out because of patient's decision for other reasons 08/04/2012   Sleep apnea    no CPAP    Tobacco History: Social History   Tobacco Use  Smoking Status Never   Passive exposure: Never  Smokeless Tobacco Never   Counseling given: Not Answered   Outpatient Medications Prior to Visit  Medication Sig Dispense Refill   clomiPHENE (CLOMID) 50 MG tablet Take 50 mg by mouth daily.     tadalafil (CIALIS) 5 MG tablet      No facility-administered medications prior to visit.   Review of Systems  Review of Systems  Constitutional: Negative.  Negative for fatigue.  HENT: Negative.    Respiratory: Negative.    Cardiovascular: Negative.   Psychiatric/Behavioral:  Negative for sleep disturbance.    Physical Exam  BP 118/66 (BP Location: Left Arm, Patient Position: Sitting, Cuff Size: Normal)   Pulse 64   Ht '5\' 10"'$  (1.778 m)   Wt 220 lb (99.8 kg)   SpO2 97%   BMI 31.57 kg/m  Physical Exam Constitutional:      Appearance: Normal appearance.  HENT:     Head: Normocephalic and atraumatic.     Mouth/Throat:     Mouth: Mucous membranes are moist.     Pharynx: Oropharynx is clear.  Comments: Mallampati class III Cardiovascular:     Rate and Rhythm: Normal rate and regular rhythm.  Pulmonary:     Effort: Pulmonary effort is normal.     Breath sounds: Normal breath sounds.     Comments: CTA Musculoskeletal:        General: Normal range of motion.  Skin:    General: Skin is warm and dry.  Neurological:     General: No focal deficit present.     Mental Status: He is alert and oriented to person, place, and time. Mental status is at baseline.  Psychiatric:        Mood and Affect: Mood normal.        Behavior: Behavior normal.        Thought Content: Thought content normal.        Judgment: Judgment normal.      Lab Results:  CBC    Component  Value Date/Time   WBC 5.0 09/24/2022 1443   RBC 4.86 09/24/2022 1443   HGB 15.2 09/24/2022 1443   HGB 16.4 03/05/2022 0949   HCT 44.1 09/24/2022 1443   PLT 130.0 (L) 09/24/2022 1443   PLT 132 (L) 03/05/2022 0949   MCV 90.7 09/24/2022 1443   MCH 31.8 03/05/2022 0949   MCHC 34.3 09/24/2022 1443   RDW 12.4 09/24/2022 1443   LYMPHSABS 1.6 09/24/2022 1443   MONOABS 0.5 09/24/2022 1443   EOSABS 0.2 09/24/2022 1443   BASOSABS 0.0 09/24/2022 1443    BMET    Component Value Date/Time   NA 139 09/24/2022 1443   K 4.2 09/24/2022 1443   CL 104 09/24/2022 1443   CO2 30 09/24/2022 1443   GLUCOSE 92 09/24/2022 1443   BUN 21 09/24/2022 1443   CREATININE 0.92 09/24/2022 1443   CREATININE 0.96 03/05/2022 0949   CALCIUM 9.6 09/24/2022 1443   GFRNONAA >60 03/05/2022 0949   GFRAA >60 09/26/2017 0028    BNP No results found for: "BNP"  ProBNP No results found for: "PROBNP"  Imaging: MR Brain W Wo Contrast  Result Date: 10/12/2022 CLINICAL DATA:  Facial numbness and tingling EXAM: MRI HEAD WITHOUT AND WITH CONTRAST TECHNIQUE: Multiplanar, multiecho pulse sequences of the brain and surrounding structures were obtained without and with intravenous contrast. CONTRAST:  10 mL gadopiclenol (Vueway) 0.5 mmol/ml solution COMPARISON:  None Available. FINDINGS: Brain: No acute infarct, mass effect or extra-axial collection. No acute or chronic hemorrhage. Normal white matter signal, parenchymal volume and CSF spaces. The midline structures are normal. Vascular: Major flow voids are preserved. Skull and upper cervical spine: Normal calvarium and skull base. Visualized upper cervical spine and soft tissues are normal. Sinuses/Orbits:No paranasal sinus fluid levels or advanced mucosal thickening. No mastoid or middle ear effusion. Normal orbits. IMPRESSION: Normal brain MRI. Electronically Signed   By: Ulyses Jarred M.D.   On: 10/12/2022 01:29   US ABDOMEN LIMITED RUQ (LIVER/GB)  Result Date:  09/29/2022 CLINICAL DATA:  Elevated LFTs EXAM: ULTRASOUND ABDOMEN LIMITED RIGHT UPPER QUADRANT COMPARISON:  None Available. FINDINGS: Gallbladder: Surgically absent Common bile duct: Diameter: Not visualized. Liver: No focal lesion identified. Within normal limits in parenchymal echogenicity. Portal vein is patent on color Doppler imaging with normal direction of blood flow towards the liver. Other: None. IMPRESSION: 1. The gallbladder surgically absent and the common bile duct is not visualized. The liver is unremarkable in appearance. Electronically Signed   By: Dorise Bullion III M.D.   On: 09/29/2022 12:05     Assessment & Plan:  Severe sleep apnea Home sleep study 10/13/21 showed severe OSA, AHI 34.2/hr with SpO2 low 83% (average 93%)  Reviewed risks of untreated sleep apnea and treatment options  He does not think he would be able to comfortably wear CPAP. He is interested in oral appliance  Its been several years since he has had an episode of afib.  He is sleeping well. No daytime sleepiness. Referral place to orthodontics Advised against driving if experiencing excessive daytime sleepiness. Encourage side sleeping position and weight loss.  FU in 6 months or sooner if needed   Paroxysmal atrial fibrillation (HCC) - Regular rate and rhythm on exam - No recent afb - Not on anticoagulation or rate controlling medication      Martyn Ehrich, NP 10/27/2022

## 2022-10-27 NOTE — Patient Instructions (Signed)
Sleep study showed severe sleep apnea, you had on average 34 apneas/hyponeas an hour  Do recommend CPAP, however, respect your decision to hold off. Alternatively we can try oral appliance for treatment of OSA.  Recommend side sleeping position or elevate head 30 if sleeping on your back. Do not drive alcohol prior to bedtime. Do not drive if experiencing excessive daytime sleepiness.   Follow-up 6 months with [facility] NP or sooner if needed   Sleep Apnea Sleep apnea is a condition in which breathing pauses or becomes shallow during sleep. People with sleep apnea usually snore loudly. They may have times when they gasp and stop breathing for 10 seconds or more during sleep. This may happen many times during the night. Sleep apnea disrupts your sleep and keeps your body from getting the rest that it needs. This condition can increase your risk of certain health problems, including: Heart attack. Stroke. Obesity. Type 2 diabetes. Heart failure. Irregular heartbeat. High blood pressure. The goal of treatment is to help you breathe normally again. What are the causes?  The most common cause of sleep apnea is a collapsed or blocked airway. There are three kinds of sleep apnea: Obstructive sleep apnea. This kind is caused by a blocked or collapsed airway. Central sleep apnea. This kind happens when the part of the brain that controls breathing does not send the correct signals to the muscles that control breathing. Mixed sleep apnea. This is a combination of obstructive and central sleep apnea. What increases the risk? You are more likely to develop this condition if you: Are overweight. Smoke. Have a smaller than normal airway. Are older. Are male. Drink alcohol. Take sedatives or tranquilizers. Have a family history of sleep apnea. Have a tongue or tonsils that are larger than normal. What are the signs or symptoms? Symptoms of this condition include: Trouble staying asleep. Loud  snoring. Morning headaches. Waking up gasping. Dry mouth or sore throat in the morning. Daytime sleepiness and tiredness. If you have daytime fatigue because of sleep apnea, you may be more likely to have: Trouble concentrating. Forgetfulness. Irritability or mood swings. Personality changes. Feelings of depression. Sexual dysfunction. This may include loss of interest if you are male, or erectile dysfunction if you are male. How is this diagnosed? This condition may be diagnosed with: A medical history. A physical exam. A series of tests that are done while you are sleeping (sleep study). These tests are usually done in a sleep lab, but they may also be done at home. How is this treated? Treatment for this condition aims to restore normal breathing and to ease symptoms during sleep. It may involve managing health issues that can affect breathing, such as high blood pressure or obesity. Treatment may include: Sleeping on your side. Using a decongestant if you have nasal congestion. Avoiding the use of depressants, including alcohol, sedatives, and narcotics. Losing weight if you are overweight. Making changes to your diet. Quitting smoking. Using a device to open your airway while you sleep, such as: An oral appliance. This is a custom-made mouthpiece that shifts your lower jaw forward. A continuous positive airway pressure (CPAP) device. This device blows air through a mask when you breathe out (exhale). A nasal expiratory positive airway pressure (EPAP) device. This device has valves that you put into each nostril. A bi-level positive airway pressure (BIPAP) device. This device blows air through a mask when you breathe in (inhale) and breathe out (exhale). Having surgery if other treatments do not  work. During surgery, excess tissue is removed to create a wider airway. Follow these instructions at home: Lifestyle Make any lifestyle changes that your health care provider  recommends. Eat a healthy, well-balanced diet. Take steps to lose weight if you are overweight. Avoid using depressants, including alcohol, sedatives, and narcotics. Do not use any products that contain nicotine or tobacco. These products include cigarettes, chewing tobacco, and vaping devices, such as e-cigarettes. If you need help quitting, ask your health care provider. General instructions Take over-the-counter and prescription medicines only as told by your health care provider. If you were given a device to open your airway while you sleep, use it only as told by your health care provider. If you are having surgery, make sure to tell your health care provider you have sleep apnea. You may need to bring your device with you. Keep all follow-up visits. This is important. Contact a health care provider if: The device that you received to open your airway during sleep is uncomfortable or does not seem to be working. Your symptoms do not improve. Your symptoms get worse. Get help right away if: You develop: Chest pain. Shortness of breath. Discomfort in your back, arms, or stomach. You have: Trouble speaking. Weakness on one side of your body. Drooping in your face. These symptoms may represent a serious problem that is an emergency. Do not wait to see if the symptoms will go away. Get medical help right away. Call your local emergency services (911 in the [place] Do not drive yourself to the hospital. Summary Sleep apnea is a condition in which breathing pauses or becomes shallow during sleep. The most common cause is a collapsed or blocked airway. The goal of treatment is to restore normal breathing and to ease symptoms during sleep. This information is not intended to replace advice given to you by your health care provider. Make sure you discuss any questions you have with your health care provider. Document Revised: 05/07/2021 Document Reviewed: 09/06/2020 Elsevier Patient  Education  [place].

## 2022-10-27 NOTE — Assessment & Plan Note (Signed)
Home sleep study 10/13/21 showed severe OSA, AHI 34.2/hr with SpO2 low 83% (average 93%)  Reviewed risks of untreated sleep apnea and treatment options  He does not think he would be able to comfortably wear CPAP. He is interested in oral appliance  Its been several years since he has had an episode of afib.  He is sleeping well. No daytime sleepiness. Referral place to orthodontics Advised against driving if experiencing excessive daytime sleepiness. Encourage side sleeping position and weight loss.  FU in 6 months or sooner if needed

## 2022-10-28 NOTE — Progress Notes (Signed)
Reviewed and agree with assessment/plan.   Chesley Mires, MD [facility] Pulmonary/Critical Care 10/28/2022, 10:37 AM Pager:  [phone]

## 2022-12-02 ENCOUNTER — Encounter: Payer: Self-pay | Admitting: Gastroenterology

## 2022-12-14 ENCOUNTER — Encounter: Payer: Self-pay | Admitting: Gastroenterology

## 2023-01-20 IMAGING — US US ABDOMEN COMPLETE
1 series · 15 of 25 positions shown · non-contrast
Comparison: None Available.

CLINICAL DATA: Thrombocytopenia

EXAM:
ABDOMEN ULTRASOUND COMPLETE

[Series 1: us abdomen complete mc & wl · 15 of 91 slices shown]
[im 1/91]
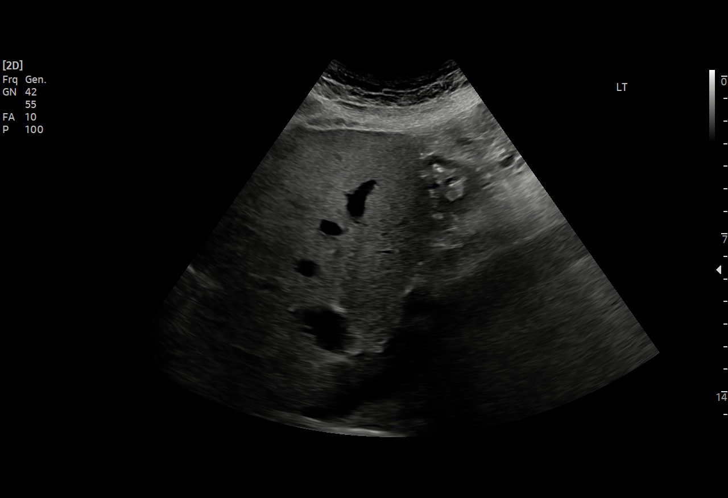
[im 8/91]
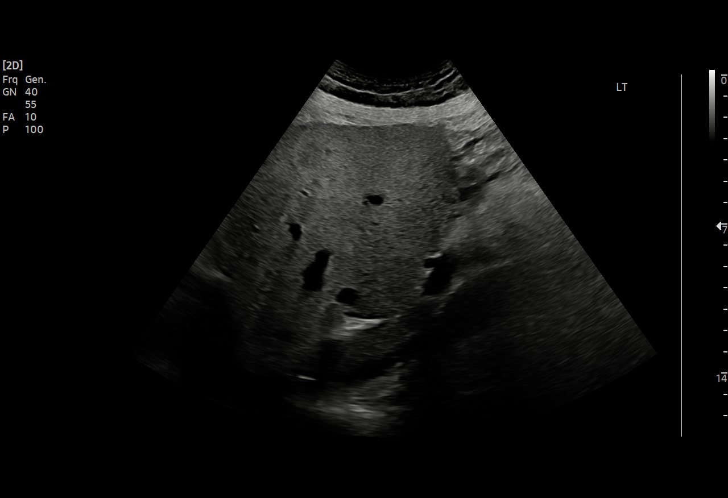
[im 16/91]
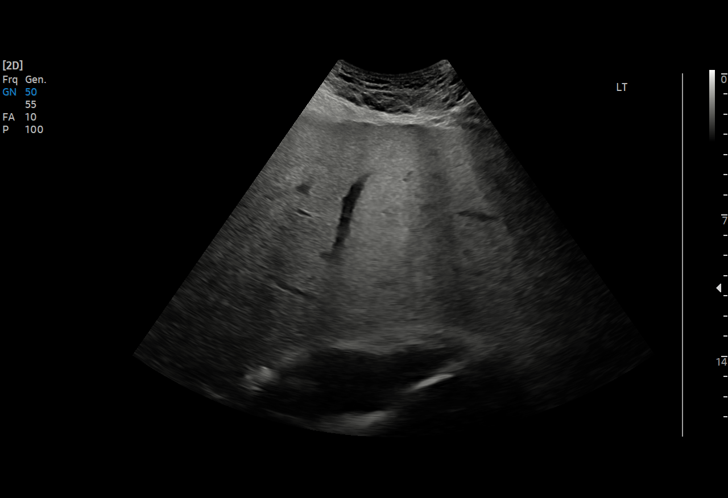
[im 19/91]
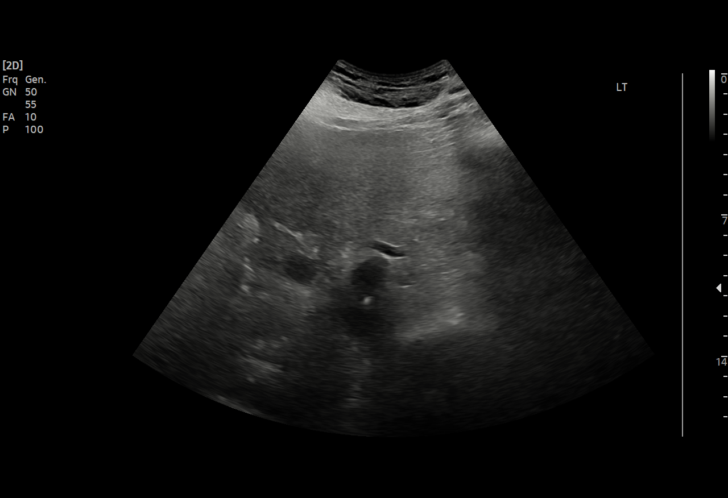
[im 27/91]
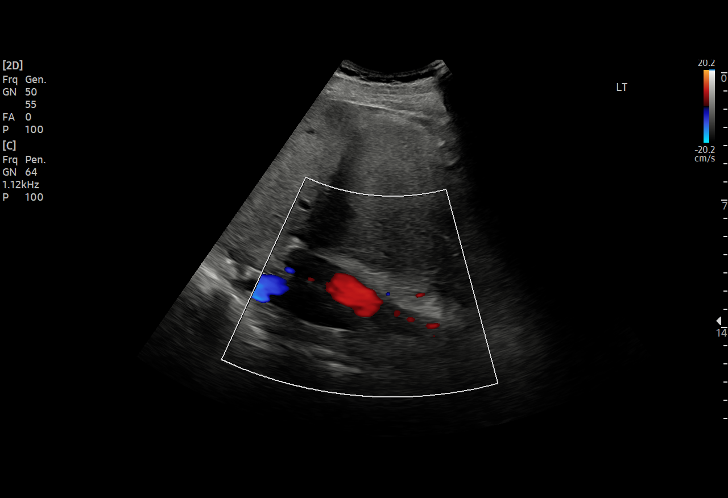
[im 34/91]
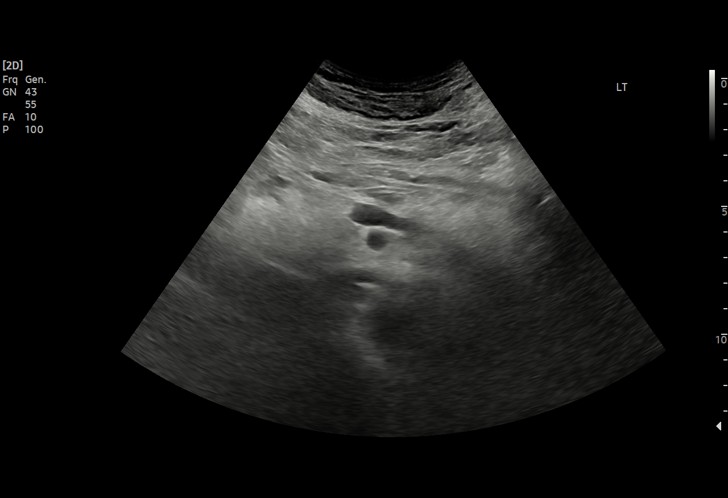
[im 38/91]
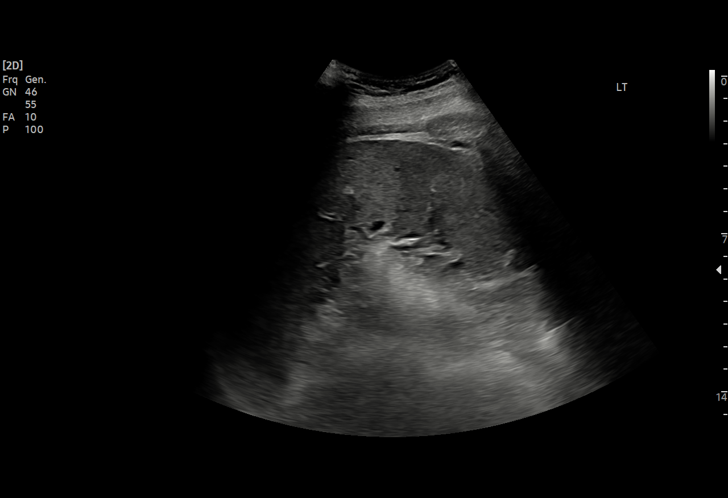
[im 46/91]
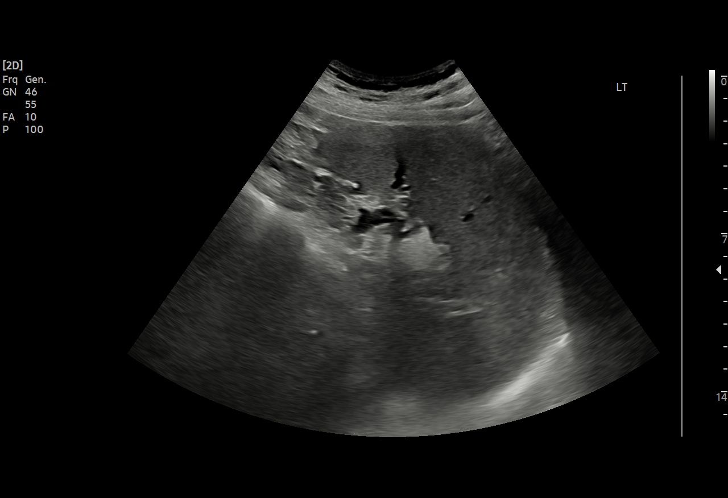
[im 53/91]
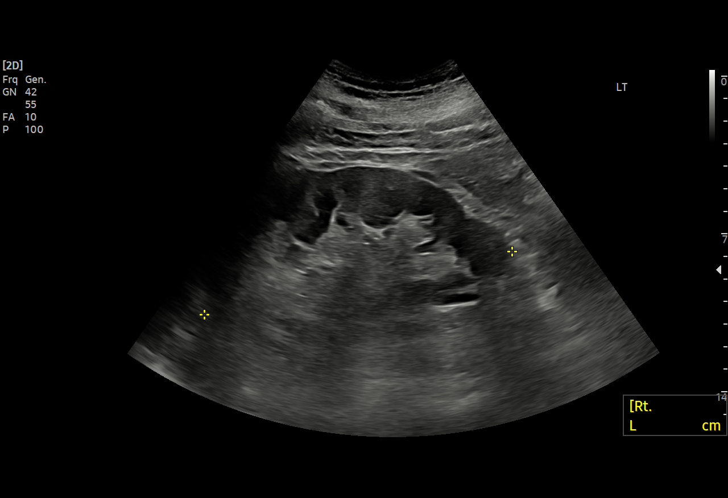
[im 57/91]
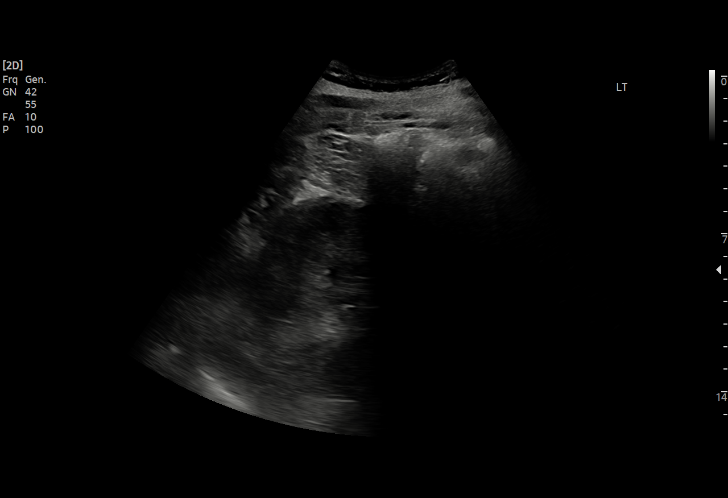
[im 64/91]
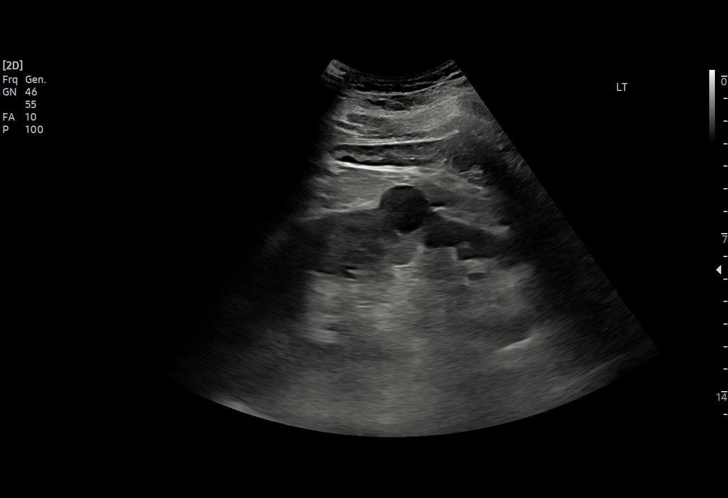
[im 72/91]
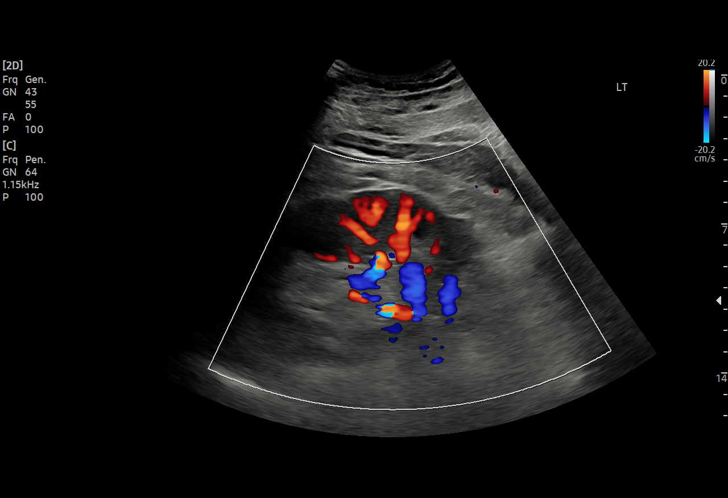
[im 76/91]
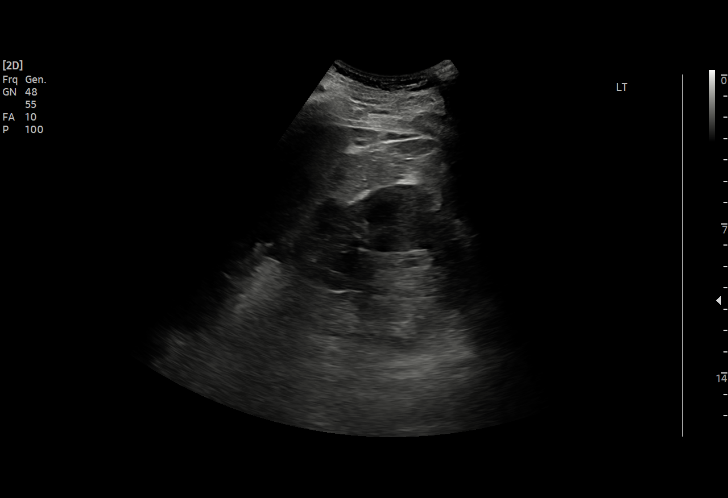
[im 83/91]
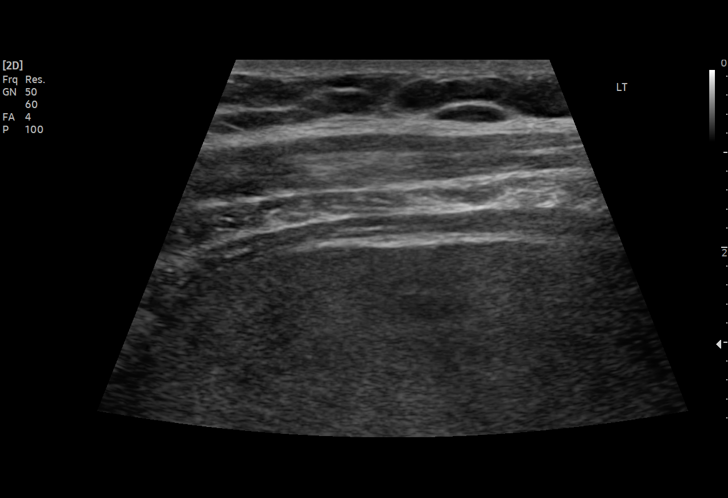
[im 91/91]
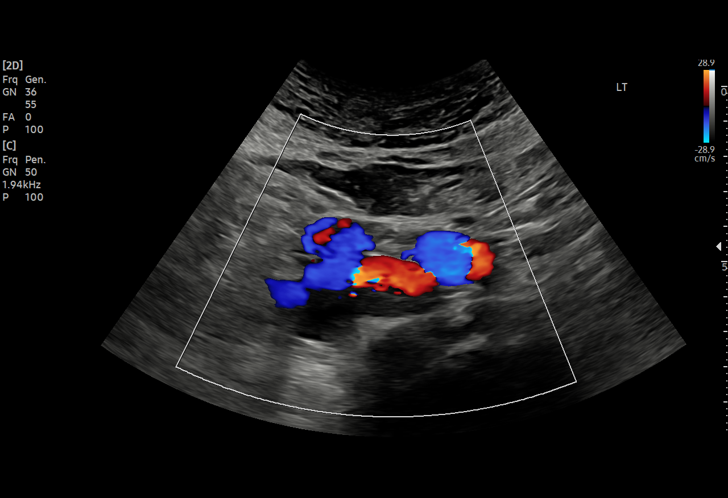

[15 of 25 positions shown; findings below may reference images not displayed]

FINDINGS: Gallbladder: Surgically absent.

Common bile duct: Diameter: 5 mm

Liver: Mildly nodular contour with increased echogenicity of the
parenchyma. No focal mass identified. Portal vein is patent on color
Doppler imaging with normal direction of blood flow towards the
liver.

IVC: No abnormality visualized.

Pancreas: Visualized portion unremarkable.

Spleen: Enlarged measuring 14.6 cm in length.

Right Kidney: Length: 13.9 cm. Echogenicity within normal limits. No
mass or hydronephrosis visualized.

Left Kidney: Length: 14.6 cm. Echogenicity within normal limits.
cm anechoic cyst in the midpole. No suspicious mass or
hydronephrosis visualized.

Abdominal aorta: No aneurysm visualized.

Other findings: None.
IMPRESSION: 1. Mildly nodular contour of the liver with increased echogenicity
of the parenchyma, correlate for cirrhosis.
2. Splenomegaly.
3. Left renal cyst.

## 2023-02-11 DIAGNOSIS — M75121 Complete rotator cuff tear or rupture of right shoulder, not specified as traumatic: Secondary | ICD-10-CM | POA: Diagnosis not present

## 2023-03-03 DIAGNOSIS — M9902 Segmental and somatic dysfunction of thoracic region: Secondary | ICD-10-CM | POA: Diagnosis not present

## 2023-03-03 DIAGNOSIS — M5386 Other specified dorsopathies, lumbar region: Secondary | ICD-10-CM | POA: Diagnosis not present

## 2023-03-03 DIAGNOSIS — M9905 Segmental and somatic dysfunction of pelvic region: Secondary | ICD-10-CM | POA: Diagnosis not present

## 2023-03-03 DIAGNOSIS — M5137 Other intervertebral disc degeneration, lumbosacral region: Secondary | ICD-10-CM | POA: Diagnosis not present

## 2023-03-03 DIAGNOSIS — M9903 Segmental and somatic dysfunction of lumbar region: Secondary | ICD-10-CM | POA: Diagnosis not present

## 2023-03-11 DIAGNOSIS — M9903 Segmental and somatic dysfunction of lumbar region: Secondary | ICD-10-CM | POA: Diagnosis not present

## 2023-03-11 DIAGNOSIS — M5386 Other specified dorsopathies, lumbar region: Secondary | ICD-10-CM | POA: Diagnosis not present

## 2023-03-11 DIAGNOSIS — M9905 Segmental and somatic dysfunction of pelvic region: Secondary | ICD-10-CM | POA: Diagnosis not present

## 2023-03-11 DIAGNOSIS — M5137 Other intervertebral disc degeneration, lumbosacral region: Secondary | ICD-10-CM | POA: Diagnosis not present

## 2023-03-17 ENCOUNTER — Ambulatory Visit: Payer: BC Managed Care – PPO | Admitting: Dietician

## 2023-03-25 DIAGNOSIS — M5137 Other intervertebral disc degeneration, lumbosacral region: Secondary | ICD-10-CM | POA: Diagnosis not present

## 2023-03-25 DIAGNOSIS — M9903 Segmental and somatic dysfunction of lumbar region: Secondary | ICD-10-CM | POA: Diagnosis not present

## 2023-03-25 DIAGNOSIS — M9905 Segmental and somatic dysfunction of pelvic region: Secondary | ICD-10-CM | POA: Diagnosis not present

## 2023-03-25 DIAGNOSIS — M5386 Other specified dorsopathies, lumbar region: Secondary | ICD-10-CM | POA: Diagnosis not present

## 2023-04-01 DIAGNOSIS — M5386 Other specified dorsopathies, lumbar region: Secondary | ICD-10-CM | POA: Diagnosis not present

## 2023-04-01 DIAGNOSIS — M9905 Segmental and somatic dysfunction of pelvic region: Secondary | ICD-10-CM | POA: Diagnosis not present

## 2023-04-01 DIAGNOSIS — M9903 Segmental and somatic dysfunction of lumbar region: Secondary | ICD-10-CM | POA: Diagnosis not present

## 2023-04-01 DIAGNOSIS — M5137 Other intervertebral disc degeneration, lumbosacral region: Secondary | ICD-10-CM | POA: Diagnosis not present

## 2023-04-14 DIAGNOSIS — M9905 Segmental and somatic dysfunction of pelvic region: Secondary | ICD-10-CM | POA: Diagnosis not present

## 2023-04-14 DIAGNOSIS — M5386 Other specified dorsopathies, lumbar region: Secondary | ICD-10-CM | POA: Diagnosis not present

## 2023-04-14 DIAGNOSIS — M9903 Segmental and somatic dysfunction of lumbar region: Secondary | ICD-10-CM | POA: Diagnosis not present

## 2023-04-14 DIAGNOSIS — M5137 Other intervertebral disc degeneration, lumbosacral region: Secondary | ICD-10-CM | POA: Diagnosis not present

## 2023-04-22 DIAGNOSIS — M5386 Other specified dorsopathies, lumbar region: Secondary | ICD-10-CM | POA: Diagnosis not present

## 2023-04-22 DIAGNOSIS — M9905 Segmental and somatic dysfunction of pelvic region: Secondary | ICD-10-CM | POA: Diagnosis not present

## 2023-04-22 DIAGNOSIS — M5137 Other intervertebral disc degeneration, lumbosacral region: Secondary | ICD-10-CM | POA: Diagnosis not present

## 2023-04-22 DIAGNOSIS — M9903 Segmental and somatic dysfunction of lumbar region: Secondary | ICD-10-CM | POA: Diagnosis not present

## 2023-04-27 ENCOUNTER — Ambulatory Visit: Payer: BC Managed Care – PPO | Admitting: Primary Care

## 2023-04-29 ENCOUNTER — Encounter: Payer: Self-pay | Admitting: Gastroenterology

## 2023-05-06 DIAGNOSIS — M9905 Segmental and somatic dysfunction of pelvic region: Secondary | ICD-10-CM | POA: Diagnosis not present

## 2023-05-06 DIAGNOSIS — M9903 Segmental and somatic dysfunction of lumbar region: Secondary | ICD-10-CM | POA: Diagnosis not present

## 2023-05-06 DIAGNOSIS — M5386 Other specified dorsopathies, lumbar region: Secondary | ICD-10-CM | POA: Diagnosis not present

## 2023-05-06 DIAGNOSIS — M5137 Other intervertebral disc degeneration, lumbosacral region: Secondary | ICD-10-CM | POA: Diagnosis not present

## 2023-05-20 DIAGNOSIS — M9905 Segmental and somatic dysfunction of pelvic region: Secondary | ICD-10-CM | POA: Diagnosis not present

## 2023-05-20 DIAGNOSIS — M9903 Segmental and somatic dysfunction of lumbar region: Secondary | ICD-10-CM | POA: Diagnosis not present

## 2023-05-20 DIAGNOSIS — M5137 Other intervertebral disc degeneration, lumbosacral region: Secondary | ICD-10-CM | POA: Diagnosis not present

## 2023-05-20 DIAGNOSIS — M5386 Other specified dorsopathies, lumbar region: Secondary | ICD-10-CM | POA: Diagnosis not present

## 2023-05-27 DIAGNOSIS — M9903 Segmental and somatic dysfunction of lumbar region: Secondary | ICD-10-CM | POA: Diagnosis not present

## 2023-05-27 DIAGNOSIS — M9905 Segmental and somatic dysfunction of pelvic region: Secondary | ICD-10-CM | POA: Diagnosis not present

## 2023-05-27 DIAGNOSIS — M5137 Other intervertebral disc degeneration, lumbosacral region: Secondary | ICD-10-CM | POA: Diagnosis not present

## 2023-05-27 DIAGNOSIS — M5386 Other specified dorsopathies, lumbar region: Secondary | ICD-10-CM | POA: Diagnosis not present

## 2023-06-25 DIAGNOSIS — M75121 Complete rotator cuff tear or rupture of right shoulder, not specified as traumatic: Secondary | ICD-10-CM | POA: Diagnosis not present

## 2023-07-14 DIAGNOSIS — M25511 Pain in right shoulder: Secondary | ICD-10-CM | POA: Diagnosis not present

## 2023-07-27 DIAGNOSIS — M25511 Pain in right shoulder: Secondary | ICD-10-CM | POA: Diagnosis not present

## 2023-08-05 ENCOUNTER — Ambulatory Visit: Payer: BC Managed Care – PPO | Admitting: Gastroenterology

## 2023-08-05 ENCOUNTER — Encounter: Payer: Self-pay | Admitting: Gastroenterology

## 2023-08-05 ENCOUNTER — Other Ambulatory Visit: Payer: BC Managed Care – PPO

## 2023-08-05 VITALS — BP 128/68 | HR 98 | Ht 70.0 in | Wt 217.0 lb

## 2023-08-05 DIAGNOSIS — D225 Melanocytic nevi of trunk: Secondary | ICD-10-CM | POA: Diagnosis not present

## 2023-08-05 DIAGNOSIS — R161 Splenomegaly, not elsewhere classified: Secondary | ICD-10-CM | POA: Diagnosis not present

## 2023-08-05 DIAGNOSIS — K7460 Unspecified cirrhosis of liver: Secondary | ICD-10-CM

## 2023-08-05 DIAGNOSIS — Z85828 Personal history of other malignant neoplasm of skin: Secondary | ICD-10-CM | POA: Diagnosis not present

## 2023-08-05 DIAGNOSIS — L570 Actinic keratosis: Secondary | ICD-10-CM | POA: Diagnosis not present

## 2023-08-05 DIAGNOSIS — L814 Other melanin hyperpigmentation: Secondary | ICD-10-CM | POA: Diagnosis not present

## 2023-08-05 LAB — COMPREHENSIVE METABOLIC PANEL
ALT: 18 U/L (ref 0–53)
AST: 21 U/L (ref 0–37)
Albumin: 4.6 g/dL (ref 3.5–5.2)
Alkaline Phosphatase: 92 U/L (ref 39–117)
BUN: 14 mg/dL (ref 6–23)
CO2: 29 meq/L (ref 19–32)
Calcium: 9.6 mg/dL (ref 8.4–10.5)
Chloride: 105 meq/L (ref 96–112)
Creatinine, Ser: 0.94 mg/dL (ref 0.40–1.50)
GFR: 89.17 mL/min (ref >60.00)
Glucose, Bld: 83 mg/dL (ref 70–99)
Potassium: 4 meq/L (ref 3.5–5.1)
Sodium: 141 meq/L (ref 135–145)
Total Bilirubin: 0.8 mg/dL (ref 0.2–1.2)
Total Protein: 7.2 g/dL (ref 6.0–8.3)

## 2023-08-05 LAB — CBC WITH DIFFERENTIAL/PLATELET
Basophils Absolute: 0 10*3/uL (ref 0.0–0.1)
Basophils Relative: 0.7 % (ref 0.0–3.0)
Eosinophils Absolute: 0.2 10*3/uL (ref 0.0–0.7)
Eosinophils Relative: 3.6 % (ref 0.0–5.0)
HCT: 45.3 % (ref 39.0–52.0)
Hemoglobin: 15.5 g/dL (ref 13.0–17.0)
Lymphocytes Relative: 29.3 % (ref 12.0–46.0)
Lymphs Abs: 1.7 10*3/uL (ref 0.7–4.0)
MCHC: 34.2 g/dL (ref 30.0–36.0)
MCV: 90 fL (ref 78.0–100.0)
Monocytes Absolute: 0.5 10*3/uL (ref 0.1–1.0)
Monocytes Relative: 9 % (ref 3.0–12.0)
Neutro Abs: 3.4 10*3/uL (ref 1.4–7.7)
Neutrophils Relative %: 57.4 % (ref 43.0–77.0)
Platelets: 135 10*3/uL — ABNORMAL LOW (ref 150.0–400.0)
RBC: 5.03 Mil/uL (ref 4.22–5.81)
RDW: 12.4 % (ref 11.5–15.5)
WBC: 6 10*3/uL (ref 4.0–10.5)

## 2023-08-05 LAB — PROTIME-INR
INR: 1.1 {ratio} — ABNORMAL HIGH (ref 0.8–1.0)
Prothrombin Time: 11.5 s (ref 9.6–13.1)

## 2023-08-05 NOTE — Patient Instructions (Signed)
Your provider has requested that you go to the basement level for lab work before leaving today. Press "B" on the elevator. The lab is located at the first door on the left as you exit the elevator.   You have been scheduled for an abdominal ultrasound at [facility] Radiology (1st floor of hospital) on 09/11/2023 at 9:00am. Please arrive 15 minutes prior to your appointment for registration. Make certain not to have anything to eat or drink 6 hours prior to your appointment. Should you need to reschedule your appointment, please contact radiology at [phone]. This test typically takes about 30 minutes to perform.  _______________________________________________________  If your blood pressure at your visit was 140/90 or greater, please contact your primary care physician to follow up on this.  _______________________________________________________  If you are age 42 or older, your body mass index should be between 23-30. Your Body mass index is 31.14 kg/m. If this is out of the aforementioned range listed, please consider follow up with your Primary Care Provider.  If you are age 89 or younger, your body mass index should be between 19-25. Your Body mass index is 31.14 kg/m. If this is out of the aformentioned range listed, please consider follow up with your Primary Care Provider.   ________________________________________________________  The [facility] providers would like to encourage you to use [facility] to communicate with providers for non-urgent requests or questions.  Due to long hold times on the telephone, sending your provider a message by [facility] may be a faster and more efficient way to get a response.  Please allow 48 business hours for a response.  Please remember that this is for non-urgent requests.  _______________________________________________________ It was a pleasure to see you today!  Thank you for trusting me with your gastrointestinal care!

## 2023-08-05 NOTE — Progress Notes (Signed)
[facility] Progress Note  Chief Complaint: Elita Boone related cirrhosis  Subjective  History: From my December 2023 office note: "Matt follows up for his cirrhosis, last office visit June 2023.  Clinical details in that note.  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  (I did discuss this with a hepatology colleague since his last visit with me) He has portal hypertension with splenomegaly and thrombocytopenia.  MELD = 6, no ascites or hepatic encephalopathy.  No esophageal or gastric varices on screening EGD July 2023."  Labs remained stable after that visit. ________________________________   Susy Frizzle has been feeling well since I last saw him.  He denies any chronic digestive symptoms such as diarrhea nausea vomiting rectal bleeding altered appetite or weight loss.  He will occasionally have some brief crampy right upper quadrant pain that will resolve.  It bothers him when it happens because he feels similar to what occurred when he had some postoperative complications having been a living related liver donor many years ago.  No associated symptoms when that occurs.  ROS: Cardiovascular:  no chest pain Respiratory: no dyspnea  The patient's Past Medical, Family and Social History were reviewed and are on file in the EMR.  Objective:  Med list reviewed  Current Outpatient Medications:    tadalafil (CIALIS) 5 MG tablet, , Disp: , Rfl:    clomiPHENE (CLOMID) 50 MG tablet, Take 50 mg by mouth daily., Disp: , Rfl:    Vital signs in last 24 hrs: Vitals:   08/05/23 1501  BP: 128/68  Pulse: 98   Wt Readings from Last 3 Encounters:  08/05/23 217 lb (98.4 kg)  10/27/22 220 lb (99.8 kg)  09/30/22 213 lb (96.6 kg)    Physical Exam   HEENT: sclera anicteric, oral mucosa moist without lesions Neck: supple, no thyromegaly, JVD or  lymphadenopathy Cardiac: Rhythm regular without appreciable murmur,  no peripheral edema Pulm: clear to auscultation bilaterally, normal RR and effort noted Abdomen: soft, no tenderness, with active bowel sounds. Skin; warm and dry, no jaundice or rash  Labs:     Latest Ref Rng & Units 09/24/2022    2:43 PM 03/05/2022    9:49 AM 02/11/2022   10:05 AM  CBC  WBC 4.0 - 10.5 K/uL 5.0  5.3  5.3   Hemoglobin 13.0 - 17.0 g/dL 40.9  81.1  91.4   Hematocrit 39.0 - 52.0 % 44.1  46.3  48.3   Platelets 150.0 - 400.0 K/uL 130.0  132  118.0       Latest Ref Rng & Units 09/24/2022    2:43 PM 03/05/2022    9:49 AM 02/11/2022   10:05 AM  CMP  Glucose 70 - 99 mg/dL 92  782  93   BUN 6 - 23 mg/dL 21  13  16    Creatinine 0.40 - 1.50 mg/dL 9.56  2.13  0.86   Sodium 135 - 145 mEq/L 139  139  138   Potassium 3.5 - 5.1 mEq/L 4.2  3.9  4.2   Chloride 96 - 112 mEq/L 104  106  104   CO2 19 - 32 mEq/L 30  28  29    Calcium 8.4 - 10.5 mg/dL 9.6  9.7  9.7   Total Protein 6.0 - 8.3 g/dL 7.2  7.4  7.5   Total Bilirubin 0.2 -  1.2 mg/dL 0.8  0.7  0.7   Alkaline Phos 39 - 117 U/L 81  71  69   AST 0 - 37 U/L 21  21  24    ALT 0 - 53 U/L 20  19  22     Last AFP was 1.2 in December 2023  Lab Results  Component Value Date   INR 1.1 (H) 09/24/2022   INR 1.0 04/16/2022   INR 1.01 09/26/2017    ___________________________________________ Radiologic studies:  CLINICAL DATA:  Elevated LFTs   EXAM: ULTRASOUND ABDOMEN LIMITED RIGHT UPPER QUADRANT   COMPARISON:  None Available.   FINDINGS: Gallbladder:   Surgically absent   Common bile duct:   Diameter: Not visualized.   Liver:   No focal lesion identified. Within normal limits in parenchymal echogenicity. Portal vein is patent on color Doppler imaging with normal direction of blood flow towards the liver.   Other: None.   IMPRESSION: 1. The gallbladder surgically absent and the common bile duct is not visualized. The liver is unremarkable in  appearance.     Electronically Signed   By: Gerome Sam III M.D.   On: 09/29/2022 12:05 ____________________________________________ Other:   _____________________________________________ Assessment & Plan  Assessment: Encounter Diagnoses  Name Primary?   Cirrhosis, non-alcoholic (HCC) Yes   Splenomegaly    Remains clinically stable, needs updated labs for MELD score and ultrasound for HCC screening. This intermittent brief right upper quadrant pain sounds benign. (Those test were scheduled today)  On a side note, he may be moving to the [place] area for work reasons and will let us know if he needs transfer of records at any point.  Otherwise, would plan to see him in 6 months or sooner if needed.    Charlie Pitter III

## 2023-08-06 LAB — AFP TUMOR MARKER: AFP-Tumor Marker: 1.4 ng/mL (ref <6.1)

## 2023-08-11 ENCOUNTER — Ambulatory Visit (HOSPITAL_COMMUNITY)
Admission: RE | Admit: 2023-08-11 | Discharge: 2023-08-11 | Disposition: A | Discharge status: Home / Self Care | Payer: BC Managed Care – PPO | Source: Ambulatory Visit | Attending: Gastroenterology | Admitting: Gastroenterology

## 2023-08-11 DIAGNOSIS — K7460 Unspecified cirrhosis of liver: Secondary | ICD-10-CM | POA: Diagnosis not present

## 2023-08-11 DIAGNOSIS — Z9049 Acquired absence of other specified parts of digestive tract: Secondary | ICD-10-CM | POA: Diagnosis not present

## 2023-08-12 ENCOUNTER — Encounter: Payer: Self-pay | Admitting: Gastroenterology

## 2023-08-20 DIAGNOSIS — M545 Low back pain, unspecified: Secondary | ICD-10-CM | POA: Diagnosis not present

## 2023-08-20 DIAGNOSIS — M25551 Pain in right hip: Secondary | ICD-10-CM | POA: Diagnosis not present

## 2023-08-25 DIAGNOSIS — M5416 Radiculopathy, lumbar region: Secondary | ICD-10-CM | POA: Diagnosis not present

## 2023-08-28 DIAGNOSIS — M25551 Pain in right hip: Secondary | ICD-10-CM | POA: Diagnosis not present

## 2023-08-28 DIAGNOSIS — M545 Low back pain, unspecified: Secondary | ICD-10-CM | POA: Diagnosis not present

## 2023-09-02 DIAGNOSIS — M5416 Radiculopathy, lumbar region: Secondary | ICD-10-CM | POA: Diagnosis not present

## 2023-09-08 ENCOUNTER — Encounter: Payer: Self-pay | Admitting: Primary Care

## 2023-09-08 ENCOUNTER — Ambulatory Visit: Payer: BC Managed Care – PPO | Admitting: Primary Care

## 2023-09-08 VITALS — BP 122/60 | HR 64 | Temp 97.9°F | Ht 70.0 in | Wt 222.0 lb

## 2023-09-08 DIAGNOSIS — R454 Irritability and anger: Secondary | ICD-10-CM | POA: Diagnosis not present

## 2023-09-08 DIAGNOSIS — Z0001 Encounter for general adult medical examination with abnormal findings: Secondary | ICD-10-CM | POA: Diagnosis not present

## 2023-09-08 DIAGNOSIS — Z526 Liver donor: Secondary | ICD-10-CM | POA: Insufficient documentation

## 2023-09-08 DIAGNOSIS — K7460 Unspecified cirrhosis of liver: Secondary | ICD-10-CM

## 2023-09-08 DIAGNOSIS — G4730 Sleep apnea, unspecified: Secondary | ICD-10-CM | POA: Diagnosis not present

## 2023-09-08 DIAGNOSIS — Z125 Encounter for screening for malignant neoplasm of prostate: Secondary | ICD-10-CM | POA: Diagnosis not present

## 2023-09-08 DIAGNOSIS — D696 Thrombocytopenia, unspecified: Secondary | ICD-10-CM | POA: Diagnosis not present

## 2023-09-08 DIAGNOSIS — E785 Hyperlipidemia, unspecified: Secondary | ICD-10-CM | POA: Insufficient documentation

## 2023-09-08 DIAGNOSIS — R7989 Other specified abnormal findings of blood chemistry: Secondary | ICD-10-CM

## 2023-09-08 LAB — CBC WITH DIFFERENTIAL/PLATELET
Basophils Absolute: 0 10*3/uL (ref 0.0–0.1)
Basophils Relative: 0.5 % (ref 0.0–3.0)
Eosinophils Absolute: 0.2 10*3/uL (ref 0.0–0.7)
Eosinophils Relative: 4 % (ref 0.0–5.0)
HCT: 45.4 % (ref 39.0–52.0)
Hemoglobin: 15.7 g/dL (ref 13.0–17.0)
Lymphocytes Relative: 37.5 % (ref 12.0–46.0)
Lymphs Abs: 2 10*3/uL (ref 0.7–4.0)
MCHC: 34.6 g/dL (ref 30.0–36.0)
MCV: 91.1 fL (ref 78.0–100.0)
Monocytes Absolute: 0.5 10*3/uL (ref 0.1–1.0)
Monocytes Relative: 10.1 % (ref 3.0–12.0)
Neutro Abs: 2.5 10*3/uL (ref 1.4–7.7)
Neutrophils Relative %: 47.9 % (ref 43.0–77.0)
Platelets: 132 10*3/uL — ABNORMAL LOW (ref 150.0–400.0)
RBC: 4.99 Mil/uL (ref 4.22–5.81)
RDW: 12.5 % (ref 11.5–15.5)
WBC: 5.3 10*3/uL (ref 4.0–10.5)

## 2023-09-08 LAB — PSA: PSA: 2.63 ng/mL (ref 0.10–4.00)

## 2023-09-08 LAB — LIPID PANEL
Cholesterol: 159 mg/dL (ref 0–200)
HDL: 41.3 mg/dL (ref >39.00)
LDL Cholesterol: 96 mg/dL (ref 0–99)
NonHDL: 118.01
Total CHOL/HDL Ratio: 4
Triglycerides: 110 mg/dL (ref 0.0–149.0)
VLDL: 22 mg/dL (ref 0.0–40.0)

## 2023-09-08 NOTE — Assessment & Plan Note (Signed)
Referral placed to therapy

## 2023-09-08 NOTE — Assessment & Plan Note (Signed)
Immunizations UTD. Declines influenza vaccine.  Colonoscopy due, he will call to schedule PSA due and pending.  Discussed the importance of a healthy diet and regular exercise in order for weight loss, and to reduce the risk of further co-morbidity.  Exam stable. Labs pending.  Follow up in 1 year for repeat physical.

## 2023-09-08 NOTE — Assessment & Plan Note (Signed)
Not using CPAP machine.  Following with pulmonology. Follow up in January 2025 as scheduled.

## 2023-09-08 NOTE — Assessment & Plan Note (Signed)
Repeat lipid panel pending. Commended him on regular exercise

## 2023-09-08 NOTE — Assessment & Plan Note (Signed)
Stable.  Continue tadalafil PRN. Following with urology.

## 2023-09-08 NOTE — Patient Instructions (Signed)
Stop by the lab prior to leaving today. I will notify you of your results once received.   Call the GI doctor to schedule your colonoscopy.  You will either be contacted via phone regarding your referral to therapy, or you may receive a letter on your MyChart portal from our referral team with instructions for scheduling an appointment. Please let us know if you have not been contacted by anyone within two weeks.  It was a pleasure to see you today!

## 2023-09-08 NOTE — Assessment & Plan Note (Signed)
History of liver donor and cirrhosis.  Following with GI.  Repeat CBC pending per patient request.

## 2023-09-08 NOTE — Assessment & Plan Note (Signed)
Reviewed ultrasound and GI notes from 2024. Continue monitoring.

## 2023-09-08 NOTE — Assessment & Plan Note (Addendum)
Following with GI.  Reviewed office notes and labs from October 2024. CBC pending.

## 2023-09-08 NOTE — Progress Notes (Signed)
Subjective:    Patient ID: Gabriel Carroll, male    DOB: 04/08/1966, 57 y.o.   MRN: [id number]  HPI  Gabriel Carroll is a very pleasant 57 y.o. male who presents today for complete physical and follow up of chronic conditions.  He would also like to discuss symptoms of anxiety. Symptoms include irritability, feeling like he needs to be in control, getting angry if things don't go his way. He denies worrying. He treats his children who are in their 60s like they are teenagers. His wife would like for him to speak with a therapist.   Immunizations: -Tetanus: Completed in 2018 -Influenza: Declines influenza vaccine.  -Shingles: Completed Shingrix series   Diet: Fair diet.  Exercise: Regular exercise most everyday   Eye exam: Completes annually  Dental exam: Completes semi-annually    Colonoscopy: Completed in 2017, due 2024. Has not completed.   PSA: Due   BP Readings from Last 3 Encounters:  09/08/23 122/60  08/05/23 128/68  10/27/22 118/66        Review of Systems  Constitutional:  Negative for unexpected weight change.  HENT:  Negative for rhinorrhea.   Respiratory:  Negative for cough and shortness of breath.   Cardiovascular:  Negative for chest pain.  Gastrointestinal:  Negative for constipation and diarrhea.  Genitourinary:  Negative for difficulty urinating.  Musculoskeletal:  Negative for arthralgias and myalgias.  Skin:  Negative for rash.  Allergic/Immunologic: Negative for environmental allergies.  Neurological:  Negative for dizziness and headaches.  Psychiatric/Behavioral:  The patient is not nervous/anxious.          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   Low testosterone    Obesity (BMI 30-39.9)    Pleural effusion    Procedure and treatment not carried out because of patient's decision for other reasons 08/04/2012   Sleep apnea    no CPAP    Social History   Socioeconomic History   Marital status: Married    Spouse name: Not on file   Number of children: Not on file   Years of education: Not on file   Highest education level: Bachelor's degree (e.g., BA, AB, BS)  Occupational History   Not on file  Tobacco Use   Smoking status: Never    Passive exposure: Never   Smokeless tobacco: Never  Vaping Use   Vaping status: Never Used  Substance and Sexual Activity   Alcohol use: Yes    Alcohol/week: 5.0 standard drinks of alcohol    Types: 5 Cans of beer per week    Comment: occ   Drug use: No   Sexual activity: Not on file  Other Topics Concern   Not on file  Social History Narrative   Married.   2 children.   Works as a Engineer, drilling.   Enjoys mountain biking, brewing beers, hiking.    Social Determinants of Health   Financial Resource Strain: Low Risk  (09/07/2023)   Overall Financial Resource Strain (CARDIA)    Difficulty of Paying Living Expenses: Not hard at all  Food Insecurity: No Food Insecurity (09/07/2023)  Hunger Vital Sign    Worried About Running Out of Food in the Last Year: Never true    Ran Out of Food in the Last Year: Never true  Transportation Needs: No Transportation Needs (09/07/2023)   PRAPARE - Administrator, Civil Service (Medical): No    Lack of Transportation (Non-Medical): No  Physical Activity: Sufficiently Active (09/07/2023)   Exercise Vital Sign    Days of Exercise per Week: 4 days    Minutes of Exercise per Session: 40 min  Stress: No Stress Concern Present (09/07/2023)   [employer] of Occupational Health - Occupational Stress Questionnaire    Feeling of Stress : Not at all  Social  Connections: Unknown (09/07/2023)   Social Connection and Isolation Panel [NHANES]    Frequency of Communication with Friends and Family: Once a week    Frequency of Social Gatherings with Friends and Family: Once a week    Attends Religious Services: Patient declined    Database administrator or Organizations: No    Attends Engineer, structural: Not on file    Marital Status: Married  Catering manager Violence: Not on file    Past Surgical History:  Procedure Laterality Date   BUNIONECTOMY     CHOLECYSTECTOMY     foot sugery  08/2020   HARDWARE REMOVAL Left 08/22/2020   Procedure: Left foot 1st metatarsal removal of deep implant;  Surgeon: Toni Arthurs, MD;  Location: [facility];  Service: Orthopedics;  Laterality: Left;    KNEE CARTILAGE SURGERY     lt   LIGAMENT REPAIR  08/22/2020   Procedure: LIGAMENT REPAIR;  Surgeon: Toni Arthurs, MD;  Location: [facility];  Service: Orthopedics;;   liver doner     lung surgey     METATARSAL OSTEOTOMY  08/22/2020   Procedure: METATARSAL OSTEOTOMY;  Surgeon: Toni Arthurs, MD;  Location:  SURGERY CENTER;  Service: Orthopedics;;   shoulder surgery rt Right 2023   Murphy-Wainer   WEIL OSTEOTOMY Left 08/22/2020   Procedure: 1st metatarsal Scarf osteotomy; 2nd metatarsal Weil osteotomy; repair of lateral collateral ligament;  Surgeon: Toni Arthurs, MD;  Location:  SURGERY CENTER;  Service: Orthopedics;  Laterality: Left;    Family History  Problem Relation Age of Onset   Hypertension Mother    Arthritis Mother    Hypertension Father    Cancer Father    Heart disease Father    Diabetes Paternal Aunt    Diabetes Paternal Uncle    Diabetes Sister    Diabetes Brother    Asthma Maternal Grandmother    Heart disease Maternal Grandfather    Diabetes Paternal Grandmother    Colon cancer Neg Hx    Esophageal cancer Neg Hx    Rectal cancer Neg Hx    Stomach cancer Neg Hx      Allergies  Allergen Reactions   Vancomycin Other (See Comments)    Red man syndrome    Current Outpatient Medications on File Prior to Visit  Medication Sig Dispense Refill   tadalafil (CIALIS) 5 MG tablet      No current facility-administered medications on file prior to visit.    BP 122/60   Pulse 64   Temp 97.9 F (36.6 C) (Temporal)   Ht 5\' 10"  (1.778 m)   Wt 222 lb (100.7 kg)   SpO2 98%   BMI 31.85 kg/m  Objective:   Physical Exam HENT:     Right Ear:  Tympanic membrane and ear canal normal.     Left Ear: Tympanic membrane and ear canal normal.  Eyes:     Pupils: Pupils are equal, round, and reactive to light.  Cardiovascular:     Rate and Rhythm: Normal rate and regular rhythm.  Pulmonary:     Effort: Pulmonary effort is normal.     Breath sounds: Normal breath sounds.  Abdominal:     General: Bowel sounds are normal.     Palpations: Abdomen is soft.     Tenderness: There is no abdominal tenderness.  Musculoskeletal:        General: Normal range of motion.     Cervical back: Neck supple.  Skin:    General: Skin is warm and dry.  Neurological:     Mental Status: He is alert and oriented to person, place, and time.     Cranial Nerves: No cranial nerve deficit.     Deep Tendon Reflexes:     Reflex Scores:      Patellar reflexes are 2+ on the right side and 2+ on the left side. Psychiatric:        Mood and Affect: Mood normal.           Assessment & Plan:  Encounter for annual general medical examination with abnormal findings in adult Assessment & Plan: Immunizations UTD. Declines influenza vaccine.  Colonoscopy due, he will call to schedule PSA due and pending.  Discussed the importance of a healthy diet and regular exercise in order for weight loss, and to reduce the risk of further co-morbidity.  Exam stable. Labs pending.  Follow up in 1 year for repeat physical.    Cirrhosis of liver without ascites, unspecified hepatic cirrhosis  type [facility]) Assessment & Plan: Reviewed ultrasound and GI notes from 2024. Continue monitoring.    Severe sleep apnea Assessment & Plan: Not using CPAP machine.  Following with pulmonology. Follow up in January 2025 as scheduled.    Behavior-irritability Assessment & Plan: Referral placed to therapy  Orders: -     Ambulatory referral to Psychology  Screening for prostate cancer -     PSA  Thrombocytopenia (HCC) Assessment & Plan: History of liver donor and cirrhosis.  Following with GI.  Repeat CBC pending per patient request.  Orders: -     CBC with Differential/Platelet  Hyperlipidemia, unspecified hyperlipidemia type Assessment & Plan: Repeat lipid panel pending. Commended him on regular exercise  Orders: -     Lipid panel  Low serum testosterone Assessment & Plan: Stable.  Continue tadalafil PRN. Following with urology.   Liver donor Assessment & Plan: Following with GI.  Reviewed office notes and labs from October 2024. CBC pending.         Doreene Nest, NP

## 2023-09-16 DIAGNOSIS — M5416 Radiculopathy, lumbar region: Secondary | ICD-10-CM | POA: Diagnosis not present

## 2023-09-22 ENCOUNTER — Ambulatory Visit: Payer: BC Managed Care – PPO | Admitting: Psychology

## 2023-09-23 DIAGNOSIS — M5416 Radiculopathy, lumbar region: Secondary | ICD-10-CM | POA: Diagnosis not present

## 2023-09-24 DIAGNOSIS — M5416 Radiculopathy, lumbar region: Secondary | ICD-10-CM | POA: Diagnosis not present

## 2023-10-11 DIAGNOSIS — M5416 Radiculopathy, lumbar region: Secondary | ICD-10-CM | POA: Diagnosis not present

## 2023-10-18 ENCOUNTER — Ambulatory Visit: Payer: BC Managed Care – PPO | Admitting: Psychology

## 2023-10-18 DIAGNOSIS — F4322 Adjustment disorder with anxiety: Secondary | ICD-10-CM | POA: Diagnosis not present

## 2023-10-18 NOTE — Progress Notes (Signed)
   Gabriel Carroll, [facility]

## 2023-10-18 NOTE — Progress Notes (Signed)
 [facility] Counselor Initial Adult Exam  Name: Gabriel Carroll Date: 10/18/2023 MRN: [id number] DOB: 1966-10-06 PCP: Gretta Comer POUR, NP  Time spent: 8:00am-8:54am Pt is seen for a virtual video visit via caregility.  Pt joins from his home and counselor from her home office.  Pt consents to virtual visit and is aware of limitations of such visits.   Guardian/Payee:  self     Paperwork requested: No   Reason for Visit /Presenting Problem: Pt reports he is seeking counseling to assist w/  stressors in communication and relationship.  Pt reports pattern of communication in relationship that causing conflict.  Pt reports he often is more concerned w/ what is he tyring to say and not able to listen to wife's concerns, perspective.  Pt reports this often leads to defensive type communication and increasing and escalating arguments.  Pt reports causing anxiety for him and interactions in relationship.  Pt reports stressors include preparing for move to [place] by end of May 2025 for his job.  Other stressors include worries for sons- youngest vapes and recreational marijuana use and not seeming focused for future.  His oldest is now engaged and worries about his drinking at times.    Mental Status Exam: Appearance:   Well Groomed     Behavior:  Appropriate  Motor:  Normal  Speech/Language:   Clear and Coherent  Affect:  Appropriate  Mood:  anxious  Thought process:  normal  Thought content:    WNL  Sensory/Perceptual disturbances:    WNL  Orientation:  oriented to person, place, time/date, and situation  Attention:  Good  Concentration:  Good  Memory:  WNL  Fund of knowledge:   Good  Insight:    Good and Fair  Judgment:   Good  Impulse Control:  Good    Reported Symptoms:  Pt reports generally considers self laid back.  Pt reports that he has stress and anxiety related to relationship and communication patterns. Pt denies depressed mood.  Pt reports some days easily  annoyed/irritable.  Pt reports some difficult w/ sleep related to sleep apnea that he hasn't addressed and causes stress in relationship.    Risk Assessment: Danger to Self:  No Self-injurious Behavior: No Danger to Others: No Duty to Warn:no Physical Aggression / Violence:No  Access to Firearms a concern: No  Gang Involvement:No  Patient / guardian was educated about steps to take if suicide or homicide risk level increases between visits: n/a While future psychiatric events cannot be accurately predicted, the patient does not currently require acute inpatient psychiatric care and does not currently meet [facility]  involuntary commitment criteria.  Substance Abuse History: Current substance abuse: No   Pt reports when he was younger he drank a lot.  Pt reports he stopped drinking in past year after health problems. Pt reports that noticed drinking more when kids got into highschool and would go to social gatherings or when they would come home.  Pt reports at social events would have 5 beers a night.  Pt reports wasn't difficult to stop drinking and doesn't feel urge to drink in social setting.  Past Psychiatric History:   No previous psychological problems have been observed Outpatient Providers:hx of couples counseling once when kids younger. History of Psych Hospitalization: No  Psychological Testing:  none    Abuse History:  Victim of: No.,  none    Report needed: No. Victim of Neglect:No. Perpetrator of  none   Witness / Exposure to Domestic Violence:  No   Protective Services Involvement: No  Witness to Community Violence:  No   Family History:  Family History  Problem Relation Age of Onset   Hypertension Mother    Arthritis Mother    Hypertension Father    Cancer Father    Heart disease Father    Diabetes Paternal Aunt    Diabetes Paternal Uncle    Diabetes Sister    Diabetes Brother    Asthma Maternal Grandmother    Heart disease Maternal Grandfather     Diabetes Paternal Grandmother    Colon cancer Neg Hx    Esophageal cancer Neg Hx    Rectal cancer Neg Hx    Stomach cancer Neg Hx   Pt grew up in [place].  His wife grew up in [place].  He grew up w/ mom and dad, 4 older brothers, 1 older sister and 1 younger sister.   Pt has siblings in [place] and 1 in one [place].  Pt had one brother die by suicide.  Pt was 16y/o at time of his death.  Pt reports he is in contact w/ siblings mostly at holiday time.  Pt reported his parents both deceased- dad    died 10 years ago- mom 8 years ago.   Pt's wife has 2 sisters in [place] - one estranged from and a brother in [place].  His mother in law passed away a little over year ago. They helped take care of her in her last couple years.     Living situation: the patient lives with his spouse and has one dog.  Move to [place] in 2006 when kids 6 & 9.    Sexual Orientation: Straight  Relationship Status: pt has been married for 32 years married since 20.   Pt reports stressors of arguments and conflicts.  Negative communication patterns. Pt reports that his wife was unemployed for over a year a couple years ago and this was a very stressful time for them as impacted her wellbeing and sense of purpose.   If a parent, number of children / ages: 2 sons age 74 and 65.  Both living in [place] area.  His youngest has been out of the house for 2 years now.     Support Systems: friends  Pt reports a Good friend group that gets together with.  Pt reports guys trip upcoming.   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  Pt has the ability to work from home.  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  His wife works for [employer]- working  remotely.  Job can be stressor.    Military Service: No   Educational History: Education: Risk Manager: Not reported   Any  cultural differences that may affect / interfere with treatment:  not applicable   Recreation/Hobbies: Pt reports he enjoys walks w/ dog, 3 times week at gym, and enjoys mountain biking and hiking.  He also has an old pick truck that working on.  Enjoys hiking and walks together with wife.      Stressors: Marital or family conflict    Strengths: seeking counseling  Barriers:  none   Legal History: Pending legal issue / charges: The patient has no significant history of legal issues. 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  Couple years to recover from it. Burden.   Past Surgical History:  Procedure Laterality Date   BUNIONECTOMY     CHOLECYSTECTOMY     foot sugery  08/2020   HARDWARE REMOVAL Left 08/22/2020   Procedure: Left foot 1st metatarsal removal of deep implant;  Surgeon: Kit Rush, MD;  Location: [facility];  Service: Orthopedics;  Laterality: Left;    KNEE CARTILAGE SURGERY     lt   LIGAMENT REPAIR  08/22/2020   Procedure: LIGAMENT REPAIR;  Surgeon: Kit Rush, MD;  Location: [facility];  Service: Orthopedics;;   liver doner     lung surgey     METATARSAL OSTEOTOMY  08/22/2020   Procedure: METATARSAL OSTEOTOMY;  Surgeon: Kit Rush, MD;  Location: [facility];  Service: Orthopedics;;   shoulder surgery rt Right 2023   Murphy-Wainer   WEIL OSTEOTOMY Left 08/22/2020   Procedure: 1st metatarsal Scarf osteotomy; 2nd metatarsal Weil osteotomy; repair of lateral collateral ligament;  Surgeon: Kit Rush, MD;  Location: [facility];  Service: Orthopedics;  Laterality: Left;    Medications: Current Outpatient Medications  Medication Sig Dispense Refill   tadalafil (CIALIS) 5 MG tablet      No current facility-administered medications for this visit.    Allergies  Allergen Reactions   Vancomycin  Other (See Comments)    Red man syndrome    Diagnoses:  Adjustment disorder with anxious mood  Plan of Care: Pt is a 58 y/o married male seeking counseling to assist coping w/ stressors in relationship.  Pt reports pattern of communication in his relationship that is defensive and difficulty listening other perspectives.  Pt reports this is causing anxiety for him.  Pt wanting to work to increasing awareness, insight and improved communication.  Pt denies any depressive symptoms.  Pt reports that these stressors have increased since empty next in past 2 years.  Pt goal is to helping w/ recognizing communication struggles and increased effective communication to reduce stressors/anxiety about.  Pt receptive to biweekly counseling.  Pt and counselor to develop tx plan at next session.    BARBARANN APPL, LCMHC

## 2023-11-04 ENCOUNTER — Ambulatory Visit: Payer: BC Managed Care – PPO | Admitting: Psychology

## 2023-11-04 DIAGNOSIS — F4322 Adjustment disorder with anxiety: Secondary | ICD-10-CM | POA: Diagnosis not present

## 2023-11-04 NOTE — Progress Notes (Signed)
[facility] Counselor/Therapist Progress Note  Patient ID: Gabriel Carroll, MRN: [id number],    Date: 11/04/2023  Time Spent: 12:00pm-12:48pm    Treatment Type: Individual Therapy  Pt is seen for in person visit.  Reported Symptoms: stress and anxiety about communication/conflict w/ wife.  Mental Status Exam: Appearance:  Well Groomed     Behavior: Appropriate  Motor: Normal  Speech/Language:  Clear and Coherent  Affect: Appropriate  Mood: anxious  Thought process: normal  Thought content:   WNL  Sensory/Perceptual disturbances:   WNL  Orientation: oriented to person, place, time/date, and situation  Attention: Good  Concentration: Good  Memory: WNL  Fund of knowledge:  Good  Insight:   Good  Judgment:  Good  Impulse Control: Good   Risk Assessment: Danger to Self:  No Self-injurious Behavior: No Danger to Others: No Duty to Warn:no Physical Aggression / Violence:No  Access to Firearms a concern: No  Gang Involvement:No   Subjective: Counselor assessed pt current functioning per pt report. Developed tx plan w/ pt and obtained consent.  Processed interactions w/ wife and recent conflicts.  Provided psychoeducation re: effective communication.  Discussed active listening and practicing reflective statements.  Pt affect wnl. Pt reports some stressful interactions and discussions w/ wife.  Pt reports gets into pattern that quick to respond and explain self and recognizing need to listen and acknowledge what is being expressed.  Pt receptive to active listening and practices to implement.   Interventions: Assertiveness/Communication and supportive.  Diagnosis:Adjustment disorder with anxious mood  Plan: pt to f/u in 2 weeks for counseling.    Individualized Treatment Plan Strengths: Pt reports he enjoys walks w/ dog, 3 times week at gym, and enjoys mountain biking and hiking.  He also has an old pick truck that working on.  Enjoys hiking and walks together with  wife.   Supports:  friends  Pt reports a Good friend group that gets together with.     Goal/Needs for Treatment:  In order of importance to patient 1) increased effective communication 2) --- 3) ---   Client Statement of Needs: pt goal is to helping w/ recognizing communication struggles and increased effective communication to reduce stressors/anxiety about.   Treatment Level:outpt tx  Symptoms:anxiety, stress, conflict w/ wife  Client Treatment Preferences: Pt receptive to biweekly counseling.    Healthcare consumer's goal for treatment:  Counselor, Gabriel Carroll, [facility] will support the patient's ability to achieve the goals identified. Cognitive Behavioral Therapy, Assertive Communication/Conflict Resolution Training, Relaxation Training, ACT, Humanistic and other evidenced-based practices will be used to promote progress towards healthy functioning.   Healthcare consumer will: Actively participate in therapy, working towards healthy functioning.    *Justification for Continuation/Discontinuation of Goal: R=Revised, O=Ongoing, A=Achieved, D=Discontinued  Goal 1) Increase effective communication skills to assist reducing negative stressful interactions AEB pt report and therapist observation. Baseline date 11/03/22: Progress towards goal 0; How Often - Daily Target Date Goal Was reviewed Status Code Progress towards goal/Likert rating  11/03/24                 This plan has been reviewed and created by the following participants:  This plan will be reviewed at least every 12 months. Date Behavioral Health Clinician Date Guardian/Patient   11/04/23  Gabriel Carroll 11/04/23 Verbal Consent Provided                    Gabriel Carroll Gabriel Carroll

## 2023-11-08 ENCOUNTER — Ambulatory Visit (INDEPENDENT_AMBULATORY_CARE_PROVIDER_SITE_OTHER): Payer: BC Managed Care – PPO | Admitting: Primary Care

## 2023-11-08 ENCOUNTER — Encounter: Payer: Self-pay | Admitting: Primary Care

## 2023-11-08 VITALS — BP 106/71 | HR 60 | Temp 98.3°F | Ht 70.0 in | Wt 225.5 lb

## 2023-11-08 DIAGNOSIS — G4730 Sleep apnea, unspecified: Secondary | ICD-10-CM

## 2023-11-08 NOTE — Progress Notes (Signed)
@Patient  ID: Gabriel Carroll, male    DOB: 1966-06-19, 58 y.o.   MRN: [id number]  Chief Complaint  Patient presents with   Follow-up    Referring provider: Doreene Nest, NP  HPI: 58 year old male, never smoked.  Past medical history significant A-fib, sleep apnea, right rotator cuff repair, obesity.  Previous [facility] pulmonary encounter: 07/10/2022 Patient presents today for sleep consult. Hx sleep apnea and afib. He has symptoms of witnessed apnea and snoring. Snoring has improved with weight loss. No significant daytime. He is not bothered by his sleep.  Typical bedtime is 10 PM.  It takes him on average 5 to 10 minutes to fall asleep.  He wakes up twice a night.  He starts his day at 7 AM.  He had a sleep study in 2009 in [place].  He is not currently on CPAP or oxygen.  He has lost 40 pounds in the last year. Epworth score is 4.  Denies symptoms of narcolepsy, cataplexy or sleepwalking.  10/27/2022 Sleep study showed severe OSA, AHI 34.2/hr with SpO2 low 83% (average 93%)  Reviewed risks of untreated sleep apnea and treatment options  He does not think he would be able to comfortably wear CPAP. He is interested in oral appliance  Its been several years since he has had an episode of afib.  He is sleeping well. No daytime sleepiness.  Severe sleep apnea Home sleep study 10/13/22 showed severe OSA, AHI 34.2/hr with SpO2 low 83% (average 93%)  Reviewed risks of untreated sleep apnea and treatment options  He does not think he would be able to comfortably wear CPAP. He is interested in oral appliance  Its been several years since he has had an episode of afib.  He is sleeping well. No daytime sleepiness. Referral place to orthodontics Advised against driving if experiencing excessive daytime sleepiness. Encourage side sleeping position and weight loss.  FU in 6 months or sooner if needed   Paroxysmal atrial fibrillation (HCC) - Regular rate and rhythm on exam - No recent  afb - Not on anticoagulation or rate controlling medication   11/08/2023- Interim hx Patient presents today for routine follow-up OSA.  Patient has sleep study in January 2024 that showed severe obstructive sleep apnea, AHI 34.2 an hour with SpO2 low 83%.  During follow-up visit there was recommended patient be started on CPAP however patient was reluctant and was interested in pursuing an oral appliance.  Patient was not able to obtain oral appliance due to some scheduling issues and cost of the device.  He continues to have snoring symptoms which are disruptive to his partner.  Patient is open to trial of CPAP therapy.  Has had no recurrent episodes of paroxysmal A-fib.  Not on anticoagulation.    Allergies  Allergen Reactions   Vancomycin Other (See Comments)    Red man syndrome    Immunization History  Administered Date(s) Administered   PFIZER(Purple Top)SARS-COV-2 Vaccination 01/27/2020, 02/19/2020   Tdap 09/28/2017   Zoster Recombinant(Shingrix) 02/11/2022, 06/02/2022    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   Review of  Systems  Review of Systems  Constitutional: Negative.   HENT: Negative.    Respiratory: Negative.    Psychiatric/Behavioral:  Positive for sleep disturbance.    Physical Exam  BP 106/71 (BP Location: Left Arm, Patient Position: Sitting, Cuff Size: Large)   Pulse 60   Temp 98.3 F (36.8 C) (Oral)   Ht 5\' 10"  (1.778 m)   Wt 225 lb 8 oz (102.3 kg)   SpO2 97%   BMI 32.36 kg/m  Physical Exam Constitutional:      General: He is not in acute distress.    Appearance: Normal appearance. He is not ill-appearing.  HENT:     Head: Normocephalic and atraumatic.     Mouth/Throat:     Mouth: Mucous membranes are moist.     Pharynx: Oropharynx is clear.  Cardiovascular:     Rate and Rhythm: Normal rate and regular rhythm.  Pulmonary:     Effort: Pulmonary effort is normal.     Breath sounds: No wheezing or rhonchi.  Musculoskeletal:        General: Normal range of motion.  Skin:    General: Skin is warm and dry.  Neurological:     General: No focal deficit present.     Mental Status: He is alert and oriented to person, place, and time. Mental status is at baseline.  Psychiatric:        Mood and Affect: Mood normal.        Behavior: Behavior normal.        Thought Content: Thought content normal.        Judgment: Judgment normal.      Lab Results:  CBC    Component Value Date/Time   WBC 5.3 09/08/2023 1210   RBC 4.99 09/08/2023 1210   HGB 15.7 09/08/2023 1210   HGB 16.4 03/05/2022 0949   HCT 45.4 09/08/2023 1210   PLT 132.0 (L) 09/08/2023 1210   PLT 132 (L) 03/05/2022 0949   MCV 91.1 09/08/2023 1210   MCH 31.8 03/05/2022 0949   MCHC 34.6 09/08/2023 1210   RDW 12.5 09/08/2023 1210   LYMPHSABS 2.0 09/08/2023 1210   MONOABS 0.5 09/08/2023 1210   EOSABS 0.2 09/08/2023 1210   BASOSABS 0.0 09/08/2023 1210    BMET    Component Value Date/Time   NA 141 08/05/2023 1543   K 4.0 08/05/2023 1543   CL 105 08/05/2023 1543   CO2 29 08/05/2023 1543   GLUCOSE 83 08/05/2023  1543   BUN 14 08/05/2023 1543   CREATININE 0.94 08/05/2023 1543   CREATININE 0.96 03/05/2022 0949   CALCIUM 9.6 08/05/2023 1543   GFRNONAA >60 03/05/2022 0949   GFRAA >60 09/26/2017 0028    BNP No results found for: "BNP"  ProBNP No results found for: "PROBNP"  Imaging: No results found.   Assessment & Plan:   Severe sleep apnea - Sleep study in January 2024 showed severe obstructive sleep apnea, AHI 34.2 an hour  with SpO2 low 83%. He originally was hesitant to be started on CPAP and was interested in trying oral appliance, however, this was not a feasible option for him. He continues to have snoring symptoms which are disruptive to his partner. He has gained approximately 10 ls since HST in 2024.  We reviewed sleep study results again today, risks of untreated sleep apnea and treatment options.  He is open to trial of CPAP, recommend auto settings 5 to 15 cm H2O with mask of choice.  Advised patient aim to wear CPAP nightly for 4 to 6 hours or longer.  Continue to encourage weight loss efforts.  Follow-up 31 to 90 days for CPAP compliance.  If intolerant to CPAP consider referral for inspire.   Glenford Bayley, NP 11/08/2023

## 2023-11-08 NOTE — Patient Instructions (Addendum)
Sleep apnea is severe sleep apnea, recommend starting auto Cpap Aim to wear CPAP nightly 4-6 hours and with naps  See patient education for CPAP below   Follow-up: 31-90 days for CPAP compliance check with Waynetta Sandy (can be virtual)  ___________________________________________________________________________  For family member to help quit smoking  You can receive free nicotine replacement therapy ( patches, gum or mints) by calling 1-800-QUIT NOW. Please call so we can get you on the path to becoming  a non-smoker. I know it is hard, but you can do this!  Other options for assistance in smoking cessation ( As covered by your insurance benefits)  Hypnosis for smoking cessation  [employer] [phone]  Acupuncture for smoking cessation  [employer] [facility] [phone]  ____________________________________________________________________________  CPAP and BIPAP Information CPAP and BIPAP use air pressure to keep your airways open and help you breathe well. CPAP and BIPAP use different amounts of pressure. Your health care provider will tell you whether CPAP or BIPAP would be best for you. CPAP stands for continuous positive airway pressure. With CPAP, the amount of pressure stays the same while you breathe in and out. BIPAP stands for bi-level positive airway pressure. With BIPAP, the amount of pressure will be higher when you breathe in and lower when you breathe out. This allows you to take bigger breaths. CPAP or BIPAP may be used in the hospital or at home. You may need to have a sleep study before your provider can order a device for you to use at home. What are the advantages? CPAP and BIPAP are most often used for obstructive sleep apnea to keep the airways from collapsing when the muscles relax during sleep. CPAP or BIPAP can be used if you have: Chronic obstructive pulmonary disease. Heart failure. Medical conditions that cause muscle weakness. Other problems  that cause breathing to be shallow, weak, or difficult. What are the risks? Your provider will talk with you about risks. These may include: Sores on your nose or face caused from the mask, prongs, or nasal pillows. Dry or stuffy nose or nosebleeds. Feeling gassy or bloated. Sinus or lung infection if the equipment is not cleaned well. When should CPAP or BIPAP be used? In most cases, CPAP or BIPAP is used during sleep at night or whenever the main sleep time happens. It's also used during naps. People with some medical conditions may need to wear the mask when they're awake. Follow instructions from your provider about when to use your CPAP or BIPAP. What happens during CPAP or BIPAP?  Both CPAP and BIPAP use a small machine that uses electricity to create air pressure. A long tube connects the device to a plastic mask. Air is blown through the mask into your nose or mouth. The amount of pressure that's used to blow the air can be adjusted. Your provider will set the pressure setting and help you find the best mask for you. Tips for using the mask There are different types and sizes of masks. If your mask does not fit well, talk with your provider about getting a different one. Some common types of masks include: Full face masks, which fit over the mouth and nose. Nasal masks, which fit over the nose. Nasal pillow or prong masks, which fit into the nostrils. The mask needs to be snug to your face, so some people feel trapped or closed in at first. If you feel this way, you may need to get used to the mask. Hold  the mask loosely over your nose or mouth and then gradually put the the mask on more snugly. Slowly increase the amount of time you use the mask. If you have trouble with your mask not fitting well or leaking, talk with your provider. Do not stop using the mask. Tips for using the device Follow instructions from your provider about how to and how often to use the device. For home use,  CPAP and BIPAP devices come from home health care companies. There are many different brands. Your health insurance company will help to decide which device you get. Keep the CPAP or BIPAP device and attachments clean. Ask your home health care company or check the instruction book for cleaning instructions. Make sure the humidifier is filled with germ-free (sterile) water and is working correctly. This will help prevent a dry or stuffy nose or nosebleeds. A nasal saline mist or spray may keep your nose from getting dry and sore. Do not eat or drink while the CPAP or BIPAP device is on. Food or drinks could get pushed into your lungs by the pressure of the CPAP or BIPAP. Follow these instructions at home: Take over-the-counter and prescription medicines only as told by your provider. Do not smoke, vape, or use nicotine or tobacco. Contact a health care provider if: You have redness or pressure sores on your head, face, mouth, or nose from the mask or headgear. You have trouble using the CPAP or BIPAP device. You have trouble going to sleep or staying asleep. Someone tells you that you snore even when wearing your CPAP or BIPAP device. Get help right away if: You have trouble breathing. You feel confused. These symptoms may be an emergency. Get help right away. Call 911. Do not wait to see if the symptoms will go away. Do not drive yourself to the hospital. This information is not intended to replace advice given to you by your health care provider. Make sure you discuss any questions you have with your health care provider. Document Revised: 01/20/2023 Document Reviewed: 01/20/2023 Elsevier Patient Education  2024 [employer].

## 2023-11-08 NOTE — Assessment & Plan Note (Addendum)
-   Sleep study in January 2024 showed severe obstructive sleep apnea, AHI 34.2 an hour with SpO2 low 83%. He originally was hesitant to be started on CPAP and was interested in trying oral appliance, however, this was not a feasible option for him. He continues to have snoring symptoms which are disruptive to his partner. He has gained approximately 10 ls since HST in 2024.  We reviewed sleep study results again today, risks of untreated sleep apnea and treatment options.  He is open to trial of CPAP, recommend auto settings 5 to 15 cm H2O with mask of choice.  Advised patient aim to wear CPAP nightly for 4 to 6 hours or longer.  Continue to encourage weight loss efforts.  Follow-up 31 to 90 days for CPAP compliance.  If intolerant to CPAP consider referral for inspire.

## 2023-11-09 ENCOUNTER — Encounter: Payer: Self-pay | Admitting: Cardiology

## 2023-11-09 ENCOUNTER — Ambulatory Visit: Payer: BC Managed Care – PPO | Attending: Cardiology | Admitting: Cardiology

## 2023-11-09 VITALS — BP 124/62 | HR 63 | Ht 70.0 in | Wt 223.6 lb

## 2023-11-09 DIAGNOSIS — I480 Paroxysmal atrial fibrillation: Secondary | ICD-10-CM

## 2023-11-09 DIAGNOSIS — Z8249 Family history of ischemic heart disease and other diseases of the circulatory system: Secondary | ICD-10-CM

## 2023-11-09 DIAGNOSIS — R011 Cardiac murmur, unspecified: Secondary | ICD-10-CM | POA: Diagnosis not present

## 2023-11-09 NOTE — Progress Notes (Signed)
Cardiology CONSULT Note    Date:  11/09/2023   ID:  Aymen Widrig, DOB 1966-07-03, MRN [id number]  PCP:  Doreene Nest, NP  Cardiologist:  Armanda Magic, MD   Chief Complaint  Patient presents with   New Patient (Initial Visit)    Paroxysmal atrial fibrillation    Patient Profile: Gabriel Carroll is a 58 y.o. male who is being seen today for the evaluation of paroxysmal atrial fibrillation at the request of Doreene Nest, NP.  History of Present Illness:  Gabriel Carroll is a 58 y.o. male who is being seen today for the evaluation of  paroxysmal atrial fibrillation at the request of Doreene Nest, NP.  This is a 58 year old male with a history of paroxysmal atrial fibrillation initially followed by Dr. Awilda Bill was seen back in 2021 and was supposed to follow-up in 1 year but was lost to follow-up.  He now is being referred back to reestablish cardiac care.  He has not been on anticoagulation in the past due to a CHA2DS2-VASc score of 0.  He is here today for followup and is doing well.  He denies any chest pain or pressure, SOB, DOE, PND, orthopnea, LE edema, dizziness, palpitations or syncope. He is compliant with his meds and is tolerating meds with no SE.  He does not smoke but does have a fm hx of CAD in his father.   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   Low testosterone    Obesity (BMI 30-39.9)    Pleural effusion    Procedure and treatment not carried out because of patient's decision for other reasons 08/04/2012   Sleep apnea    no CPAP    Past Surgical History:  Procedure Laterality Date   BUNIONECTOMY      CHOLECYSTECTOMY     foot sugery  08/2020   HARDWARE REMOVAL Left 08/22/2020   Procedure: Left foot 1st metatarsal removal of deep implant;  Surgeon: Toni Arthurs, MD;  Location: [facility];  Service: Orthopedics;  Laterality: Left;    KNEE CARTILAGE SURGERY     lt   LIGAMENT REPAIR  08/22/2020   Procedure: LIGAMENT REPAIR;  Surgeon: Toni Arthurs, MD;  Location: [facility];  Service: Orthopedics;;   liver doner     lung surgey     METATARSAL OSTEOTOMY  08/22/2020   Procedure: METATARSAL OSTEOTOMY;  Surgeon: Toni Arthurs, MD;  Location: [facility];  Service: Orthopedics;;   shoulder surgery rt Right 2023   [facility]-W[facility]   WEIL OSTEOTOMY Left 08/22/2020   Procedure: 1st metatarsal Scarf osteotomy; 2nd metatarsal Weil osteotomy; repair of lateral collateral ligament;  Surgeon: Toni Arthurs, MD;  Location: [facility];  Service: Orthopedics;  Laterality: Left;    Current Medications: Current Meds  Medication Sig   meloxicam (MOBIC) 15 MG tablet Take 15 mg by mouth as needed.   tadalafil (CIALIS) 5 MG tablet Take 5 mg by mouth daily at 6 (six) AM.    Allergies:   Vancomycin   Social History   Socioeconomic History   Marital status: Married  Spouse name: Not on file   Number of children: Not on file   Years of education: Not on file   Highest education level: Bachelor's degree (e.g., BA, AB, BS)  Occupational History   Not on file  Tobacco Use   Smoking status: Never    Passive exposure: Never   Smokeless tobacco: Never  Vaping Use   Vaping status: Never Used  Substance and Sexual Activity   Alcohol use: Yes    Alcohol/week: 5.0 standard drinks of alcohol    Types: 5 Cans of beer per week    Comment: occ   Drug use: No   Sexual activity: Not on file  Other Topics Concern   Not on file  Social History Narrative   Married.   2 children.   Works as a Engineer, drilling.    Enjoys mountain biking, brewing beers, hiking.    Social Drivers of Corporate investment banker Strain: Low Risk  (09/07/2023)   Overall Financial Resource Strain (CARDIA)    Difficulty of Paying Living Expenses: Not hard at all  Food Insecurity: No Food Insecurity (09/07/2023)   Hunger Vital Sign    Worried About Running Out of Food in the Last Year: Never true    Ran Out of Food in the Last Year: Never true  Transportation Needs: No Transportation Needs (09/07/2023)   PRAPARE - Administrator, Civil Service (Medical): No    Lack of Transportation (Non-Medical): No  Physical Activity: Sufficiently Active (09/07/2023)   Exercise Vital Sign    Days of Exercise per Week: 4 days    Minutes of Exercise per Session: 40 min  Stress: No Stress Concern Present (09/07/2023)   [employer] of Occupational Health - Occupational Stress Questionnaire    Feeling of Stress : Not at all  Social Connections: Unknown (09/07/2023)   Social Connection and Isolation Panel [NHANES]    Frequency of Communication with Friends and Family: Once a week    Frequency of Social Gatherings with Friends and Family: Once a week    Attends Religious Services: Patient declined    Database administrator or Organizations: No    Attends Engineer, structural: Not on file    Marital Status: Married     Family History:  The patient's family history includes Arthritis in his mother; Asthma in his maternal grandmother; Cancer in his father; Diabetes in his brother, paternal aunt, paternal grandmother, paternal uncle, and sister; Heart disease in his father and maternal grandfather; Hypertension in his father and mother.   ROS:   Please see the history of present illness.    ROS All other systems reviewed and are negative.      No data to display             PHYSICAL EXAM:   VS:  BP 124/62 (BP Location: Left Arm, Patient Position: Sitting)   Pulse 63   Ht 5\' 10"  (1.778 m)   Wt 223 lb  9.6 oz (101.4 kg)   SpO2 95%   BMI 32.08 kg/m    GEN: Well nourished, well developed, in no acute distress  HEENT: normal  Neck: no JVD, carotid bruits, or masses Cardiac: RRR; no rubs, or gallops,no edema.  Intact distal pulses bilaterally. 1/6 SM at RUSB  Respiratory:  clear to auscultation bilaterally, normal work of breathing GI: soft, nontender, nondistended, + BS MS: no deformity or atrophy  Skin: warm and dry,  no rash Neuro:  Alert and Oriented x 3, Strength and sensation are intact Psych: euthymic mood, full affect  Wt Readings from Last 3 Encounters:  11/09/23 223 lb 9.6 oz (101.4 kg)  11/08/23 225 lb 8 oz (102.3 kg)  09/08/23 222 lb (100.7 kg)      Studies/Labs Reviewed:   EKG Interpretation Date/Time:  Tuesday November 09 2023 13:04:26 EST Ventricular Rate:  63 PR Interval:  196 QRS Duration:  92 QT Interval:  392 QTC Calculation: 401 R Axis:   25  Text Interpretation: Normal sinus rhythm Normal ECG When compared with ECG of 26-Sep-2017 04:19, PREVIOUS ECG IS PRESENT Confirmed by Armanda Magic ([id number]) on 11/09/2023 1:16:02 PM       Recent Labs: 08/05/2023: ALT 18; BUN 14; Creatinine, Ser 0.94; Potassium 4.0; Sodium 141 09/08/2023: Hemoglobin 15.7; Platelets 132.0   Lipid Panel    Component Value Date/Time   CHOL 159 09/08/2023 1210   TRIG 110.0 09/08/2023 1210   HDL 41.30 09/08/2023 1210   CHOLHDL 4 09/08/2023 1210   VLDL 22.0 09/08/2023 1210   LDLCALC 96 09/08/2023 1210     CHA2DS2-VASc Score = 0   This indicates a 0.2% annual risk of stroke. The patient's score is based upon: CHF History: 0 HTN History: 0 Diabetes History: 0 Stroke History: 0 Vascular Disease History: 0 Age Score: 0 Gender Score: 0   ASSESSMENT:    1. Paroxysmal atrial fibrillation (HCC)   2. Family history of early CAD   3. Heart murmur      PLAN:  In order of problems listed above:  Paroxysmal atrial fibrillation -He has been maintaining normal sinus rhythm and  denies any palpitations -CHADS2VASC score is 0 -no indication for DOAC at this point. -he has not required any CCB or BB  Family Hx of CAD -his father had CABG -the patient has never smoked -I have personally reviewed and interpreted outside labs performed by patient's PCP which showed LDL 96 and HDL 41 on 09/08/2023 -I will get a coronary Ca score to assess future cardiac risk  Heart Murmur -sounds like AVSC on exam -check 2D echo  Time Spent: 20 minutes total time of encounter, including 15 minutes spent in face-to-face patient care on the date of this encounter. This time includes coordination of care and counseling regarding above mentioned problem list. Remainder of non-face-to-face time involved reviewing chart documents/testing relevant to the patient encounter and documentation in the medical record. I have independently reviewed documentation from referring provider  Followup:  1 year  Medication Adjustments/Labs and Tests Ordered: Current medicines are reviewed at length with the patient today.  Concerns regarding medicines are outlined above.  Medication changes, Labs and Tests ordered today are listed in the Patient Instructions below.  There are no Patient Instructions on file for this visit.   Signed, Armanda Magic, MD  11/09/2023 1:22 PM    [facility] [address], [place], [place]  [address] Phone: [phone]; Fax: [phone]

## 2023-11-09 NOTE — Patient Instructions (Signed)
Medication Instructions:  Your physician recommends that you continue on your current medications as directed. Please refer to the Current Medication list given to you today.  *If you need a refill on your cardiac medications before your next appointment, please call your pharmacy*   Lab Work: None.  If you have labs (blood work) drawn today and your tests are completely normal, you will receive your results only by: MyChart Message (if you have MyChart) OR A paper copy in the mail If you have any lab test that is abnormal or we need to change your treatment, we will call you to review the results.   Testing/Procedures: Your physician has requested that you have an echocardiogram. Echocardiography is a painless test that uses sound waves to create images of your heart. It provides your doctor with information about the size and shape of your heart and how well your heart's chambers and valves are working. This procedure takes approximately one hour. There are no restrictions for this procedure. Please do NOT wear cologne, perfume, aftershave, or lotions (deodorant is allowed). Please arrive 15 minutes prior to your appointment time.  Please note: We ask at that you not bring children with you during ultrasound (echo/ vascular) testing. Due to room size and safety concerns, children are not allowed in the ultrasound rooms during exams. Our front office staff cannot provide observation of children in our lobby area while testing is being conducted. An adult accompanying a patient to their appointment will only be allowed in the ultrasound room at the discretion of the ultrasound technician under special circumstances. We apologize for any inconvenience.  Dr. Mayford Knife has ordered a coronary calcium score CT for you. This is a test that can provide early detection of coronary artery disease. Patient cost is $94-99.    Follow-Up:    Your next appointment:   1 year(s)  Provider:   Dr. Armanda Magic, MD

## 2023-11-09 NOTE — Addendum Note (Signed)
Addended by: Luellen Pucker on: 11/09/2023 01:30 PM   Modules accepted: Orders

## 2023-11-16 DIAGNOSIS — G4733 Obstructive sleep apnea (adult) (pediatric): Secondary | ICD-10-CM | POA: Diagnosis not present

## 2023-11-17 ENCOUNTER — Ambulatory Visit (HOSPITAL_COMMUNITY)
Admission: RE | Admit: 2023-11-17 | Discharge: 2023-11-17 | Disposition: A | Discharge status: Home / Self Care | Payer: Self-pay | Source: Ambulatory Visit | Attending: Cardiology | Admitting: Cardiology

## 2023-11-17 DIAGNOSIS — Z8249 Family history of ischemic heart disease and other diseases of the circulatory system: Secondary | ICD-10-CM | POA: Insufficient documentation

## 2023-11-18 ENCOUNTER — Ambulatory Visit: Payer: BC Managed Care – PPO | Admitting: Psychology

## 2023-11-18 DIAGNOSIS — F4322 Adjustment disorder with anxiety: Secondary | ICD-10-CM | POA: Diagnosis not present

## 2023-11-18 NOTE — Progress Notes (Signed)
 [facility] Counselor/Therapist Progress Note  Patient ID: Gabriel Carroll, MRN: [id number],    Date: 11/18/2023  Time Spent: 11:00am-11:55pm    Treatment Type: Individual Therapy  Pt is seen for in person visit.  Reported Symptoms: stress w/ conflict w/ wife, reports of some improved communication.  Mental Status Exam: Appearance:  Well Groomed     Behavior: Appropriate  Motor: Normal  Speech/Language:  Clear and Coherent  Affect: Appropriate  Mood: anxious  Thought process: normal  Thought content:   WNL  Sensory/Perceptual disturbances:   WNL  Orientation: oriented to person, place, time/date, and situation  Attention: Good  Concentration: Good  Memory: WNL  Fund of knowledge:  Good  Insight:   Good  Judgment:  Good  Impulse Control: Good   Risk Assessment: Danger to Self:  No Self-injurious Behavior: No Danger to Others: No Duty to Warn:no Physical Aggression / Violence:No  Access to Firearms a concern: No  Gang Involvement:No   Subjective: Counselor assessed pt current functioning per pt report.   Processed interactions w/ wife and areas of improvement and areas of concern.  continued psychoeducation re: effective communication, active listening skills.  Pt affect wnl. Pt reports some improvements w/ reflective statement at times and then other times making assumptions or getting into problem solving mode.  Pt receptive to active listening skills. Pt also recognized needs for decreasing distractions when having communication.  Pt feeling positive about making steps towards continued progress.  Interventions: Assertiveness/Communication and supportive.  Diagnosis:Adjustment disorder with anxious mood  Plan: pt to f/u in 2 weeks for counseling.    Individualized Treatment Plan Strengths: Pt reports he enjoys walks w/ dog, 3 times week at gym, and enjoys mountain biking and hiking.  He also has an old pick truck that working on.  Enjoys hiking and walks  together with wife.   Supports:  friends  Pt reports a Good friend group that gets together with.     Goal/Needs for Treatment:  In order of importance to patient 1) increased effective communication 2) --- 3) ---   Client Statement of Needs: pt goal is to helping w/ recognizing communication struggles and increased effective communication to reduce stressors/anxiety about.   Treatment Level:outpt tx  Symptoms:anxiety, stress, conflict w/ wife  Client Treatment Preferences: Pt receptive to biweekly counseling.    Healthcare consumer's goal for treatment:  Counselor, Damien Herald, [facility] will support the patient's ability to achieve the goals identified. Cognitive Behavioral Therapy, Assertive Communication/Conflict Resolution Training, Relaxation Training, ACT, Humanistic and other evidenced-based practices will be used to promote progress towards healthy functioning.   Healthcare consumer will: Actively participate in therapy, working towards healthy functioning.    *Justification for Continuation/Discontinuation of Goal: R=Revised, O=Ongoing, A=Achieved, D=Discontinued  Goal 1) Increase effective communication skills to assist reducing negative stressful interactions AEB pt report and therapist observation. Baseline date 11/03/22: Progress towards goal 0; How Often - Daily Target Date Goal Was reviewed Status Code Progress towards goal/Likert rating  11/03/24                 This plan has been reviewed and created by the following participants:  This plan will be reviewed at least every 12 months. Date Behavioral Health Clinician Date Guardian/Patient   11/04/23  [facility] 11/04/23 Verbal Consent Provided                     HERALD DAMIEN [facility]

## 2023-11-25 ENCOUNTER — Ambulatory Visit (HOSPITAL_COMMUNITY): Payer: BC Managed Care – PPO | Attending: Cardiology

## 2023-11-25 DIAGNOSIS — R011 Cardiac murmur, unspecified: Secondary | ICD-10-CM | POA: Diagnosis not present

## 2023-11-25 LAB — ECHOCARDIOGRAM COMPLETE
Area-P 1/2: 4.49 cm2
S' Lateral: 3.2 cm

## 2023-12-02 ENCOUNTER — Ambulatory Visit: Payer: BC Managed Care – PPO | Admitting: Psychology

## 2023-12-02 DIAGNOSIS — F4322 Adjustment disorder with anxiety: Secondary | ICD-10-CM | POA: Diagnosis not present

## 2023-12-02 NOTE — Progress Notes (Signed)
[facility] Counselor/Therapist Progress Note  Patient ID: Gabriel Carroll, MRN: [id number],    Date: 12/02/2023  Time Spent: 1:29pm-2:20pm    Treatment Type: Individual Therapy  Pt is seen for in person visit.  Reported Symptoms: reports of some improved communication, increased active listening, pt reports stressed w/ move  Mental Status Exam: Appearance:  Well Groomed     Behavior: Appropriate  Motor: Normal  Speech/Language:  Clear and Coherent  Affect: Appropriate  Mood: anxious  Thought process: normal  Thought content:   WNL  Sensory/Perceptual disturbances:   WNL  Orientation: oriented to person, place, time/date, and situation  Attention: Good  Concentration: Good  Memory: WNL  Fund of knowledge:  Good  Insight:   Good  Judgment:  Good  Impulse Control: Good   Risk Assessment: Danger to Self:  No Self-injurious Behavior: No Danger to Others: No Duty to Warn:no Physical Aggression / Violence:No  Access to Firearms a concern: No  Gang Involvement:No   Subjective: Counselor assessed pt current functioning per pt report.   Processed communication w/ wife and progress making.  Discussed process of change, not immediate to change patterns and working towards being consistent.  Reiterated positive communication skills.  Explored stress management and self care to assist in coping through increased stressors w/ move. Pt affect wnl. Pt reports some continued improvements w/ reflective statement and active listening.  Pt reports at times continued mistakes, pt recognize that moving in right direction. Pt discussed stressors w/move, putting house on market and building new home.  Pt identified working out as stress reliever and down time to enjoy things.     Interventions: Cognitive Behavioral Therapy, Assertiveness/Communication, and supportive.  Diagnosis:Adjustment disorder with anxious mood  Plan: pt to f/u in 2 weeks for counseling.    Individualized  Treatment Plan Strengths: Pt reports he enjoys walks w/ dog, 3 times week at gym, and enjoys mountain biking and hiking.  He also has an old pick truck that working on.  Enjoys hiking and walks together with wife.   Supports:  friends  Pt reports a Good friend group that gets together with.     Goal/Needs for Treatment:  In order of importance to patient 1) increased effective communication 2) --- 3) ---   Client Statement of Needs: pt goal is to helping w/ recognizing communication struggles and increased effective communication to reduce stressors/anxiety about.   Treatment Level:outpt tx  Symptoms:anxiety, stress, conflict w/ wife  Client Treatment Preferences: Pt receptive to biweekly counseling.    Healthcare consumer's goal for treatment:  Counselor, Forde Radon, [facility] will support the patient's ability to achieve the goals identified. Cognitive Behavioral Therapy, Assertive Communication/Conflict Resolution Training, Relaxation Training, ACT, Humanistic and other evidenced-based practices will be used to promote progress towards healthy functioning.   Healthcare consumer will: Actively participate in therapy, working towards healthy functioning.    *Justification for Continuation/Discontinuation of Goal: R=Revised, O=Ongoing, A=Achieved, D=Discontinued  Goal 1) Increase effective communication skills to assist reducing negative stressful interactions AEB pt report and therapist observation. Baseline date 11/03/22: Progress towards goal 0; How Often - Daily Target Date Goal Was reviewed Status Code Progress towards goal/Likert rating  11/03/24                 This plan has been reviewed and created by the following participants:  This plan will be reviewed at least every 12 months. Date Behavioral Health Clinician Date Guardian/Patient   11/04/23  [facility] [name] [facility] 11/04/23 Verbal  Consent Provided                    Forde Radon [facility]

## 2023-12-14 DIAGNOSIS — G4733 Obstructive sleep apnea (adult) (pediatric): Secondary | ICD-10-CM | POA: Diagnosis not present

## 2023-12-23 ENCOUNTER — Ambulatory Visit: Payer: BC Managed Care – PPO | Admitting: Psychology

## 2024-01-04 DIAGNOSIS — M5416 Radiculopathy, lumbar region: Secondary | ICD-10-CM | POA: Diagnosis not present

## 2024-03-23 DIAGNOSIS — M9911 Subluxation complex (vertebral) of cervical region: Secondary | ICD-10-CM | POA: Diagnosis not present

## 2024-03-23 DIAGNOSIS — M9914 Subluxation complex (vertebral) of sacral region: Secondary | ICD-10-CM | POA: Diagnosis not present

## 2024-03-23 DIAGNOSIS — M9913 Subluxation complex (vertebral) of lumbar region: Secondary | ICD-10-CM | POA: Diagnosis not present

## 2024-03-23 DIAGNOSIS — M9912 Subluxation complex (vertebral) of thoracic region: Secondary | ICD-10-CM | POA: Diagnosis not present

## 2024-03-29 DIAGNOSIS — M9914 Subluxation complex (vertebral) of sacral region: Secondary | ICD-10-CM | POA: Diagnosis not present

## 2024-03-29 DIAGNOSIS — M9913 Subluxation complex (vertebral) of lumbar region: Secondary | ICD-10-CM | POA: Diagnosis not present

## 2024-03-29 DIAGNOSIS — M9912 Subluxation complex (vertebral) of thoracic region: Secondary | ICD-10-CM | POA: Diagnosis not present

## 2024-03-29 DIAGNOSIS — M9911 Subluxation complex (vertebral) of cervical region: Secondary | ICD-10-CM | POA: Diagnosis not present

## 2024-03-30 DIAGNOSIS — M9914 Subluxation complex (vertebral) of sacral region: Secondary | ICD-10-CM | POA: Diagnosis not present

## 2024-03-30 DIAGNOSIS — M9912 Subluxation complex (vertebral) of thoracic region: Secondary | ICD-10-CM | POA: Diagnosis not present

## 2024-03-30 DIAGNOSIS — M9913 Subluxation complex (vertebral) of lumbar region: Secondary | ICD-10-CM | POA: Diagnosis not present

## 2024-03-30 DIAGNOSIS — M9911 Subluxation complex (vertebral) of cervical region: Secondary | ICD-10-CM | POA: Diagnosis not present

## 2024-04-03 DIAGNOSIS — M9913 Subluxation complex (vertebral) of lumbar region: Secondary | ICD-10-CM | POA: Diagnosis not present

## 2024-04-03 DIAGNOSIS — M9911 Subluxation complex (vertebral) of cervical region: Secondary | ICD-10-CM | POA: Diagnosis not present

## 2024-04-03 DIAGNOSIS — M9912 Subluxation complex (vertebral) of thoracic region: Secondary | ICD-10-CM | POA: Diagnosis not present

## 2024-04-03 DIAGNOSIS — M9914 Subluxation complex (vertebral) of sacral region: Secondary | ICD-10-CM | POA: Diagnosis not present

## 2024-04-05 DIAGNOSIS — M9914 Subluxation complex (vertebral) of sacral region: Secondary | ICD-10-CM | POA: Diagnosis not present

## 2024-04-05 DIAGNOSIS — M25551 Pain in right hip: Secondary | ICD-10-CM | POA: Diagnosis not present

## 2024-04-05 DIAGNOSIS — M9911 Subluxation complex (vertebral) of cervical region: Secondary | ICD-10-CM | POA: Diagnosis not present

## 2024-04-05 DIAGNOSIS — M9913 Subluxation complex (vertebral) of lumbar region: Secondary | ICD-10-CM | POA: Diagnosis not present

## 2024-04-05 DIAGNOSIS — M9912 Subluxation complex (vertebral) of thoracic region: Secondary | ICD-10-CM | POA: Diagnosis not present

## 2024-04-05 DIAGNOSIS — M25552 Pain in left hip: Secondary | ICD-10-CM | POA: Diagnosis not present

## 2024-04-06 DIAGNOSIS — M9914 Subluxation complex (vertebral) of sacral region: Secondary | ICD-10-CM | POA: Diagnosis not present

## 2024-04-06 DIAGNOSIS — M9912 Subluxation complex (vertebral) of thoracic region: Secondary | ICD-10-CM | POA: Diagnosis not present

## 2024-04-06 DIAGNOSIS — M9913 Subluxation complex (vertebral) of lumbar region: Secondary | ICD-10-CM | POA: Diagnosis not present

## 2024-04-06 DIAGNOSIS — M9911 Subluxation complex (vertebral) of cervical region: Secondary | ICD-10-CM | POA: Diagnosis not present

## 2024-04-10 DIAGNOSIS — M9914 Subluxation complex (vertebral) of sacral region: Secondary | ICD-10-CM | POA: Diagnosis not present

## 2024-04-10 DIAGNOSIS — M9912 Subluxation complex (vertebral) of thoracic region: Secondary | ICD-10-CM | POA: Diagnosis not present

## 2024-04-10 DIAGNOSIS — M9913 Subluxation complex (vertebral) of lumbar region: Secondary | ICD-10-CM | POA: Diagnosis not present

## 2024-04-10 DIAGNOSIS — M9911 Subluxation complex (vertebral) of cervical region: Secondary | ICD-10-CM | POA: Diagnosis not present

## 2024-04-12 DIAGNOSIS — M9913 Subluxation complex (vertebral) of lumbar region: Secondary | ICD-10-CM | POA: Diagnosis not present

## 2024-04-12 DIAGNOSIS — M9914 Subluxation complex (vertebral) of sacral region: Secondary | ICD-10-CM | POA: Diagnosis not present

## 2024-04-12 DIAGNOSIS — M9911 Subluxation complex (vertebral) of cervical region: Secondary | ICD-10-CM | POA: Diagnosis not present

## 2024-04-12 DIAGNOSIS — M9912 Subluxation complex (vertebral) of thoracic region: Secondary | ICD-10-CM | POA: Diagnosis not present

## 2024-04-13 DIAGNOSIS — M9914 Subluxation complex (vertebral) of sacral region: Secondary | ICD-10-CM | POA: Diagnosis not present

## 2024-04-13 DIAGNOSIS — M9911 Subluxation complex (vertebral) of cervical region: Secondary | ICD-10-CM | POA: Diagnosis not present

## 2024-04-13 DIAGNOSIS — M9912 Subluxation complex (vertebral) of thoracic region: Secondary | ICD-10-CM | POA: Diagnosis not present

## 2024-04-13 DIAGNOSIS — M9913 Subluxation complex (vertebral) of lumbar region: Secondary | ICD-10-CM | POA: Diagnosis not present

## 2024-04-17 DIAGNOSIS — M9914 Subluxation complex (vertebral) of sacral region: Secondary | ICD-10-CM | POA: Diagnosis not present

## 2024-04-17 DIAGNOSIS — M9911 Subluxation complex (vertebral) of cervical region: Secondary | ICD-10-CM | POA: Diagnosis not present

## 2024-04-17 DIAGNOSIS — M9913 Subluxation complex (vertebral) of lumbar region: Secondary | ICD-10-CM | POA: Diagnosis not present

## 2024-04-17 DIAGNOSIS — M9912 Subluxation complex (vertebral) of thoracic region: Secondary | ICD-10-CM | POA: Diagnosis not present

## 2024-04-19 DIAGNOSIS — M9911 Subluxation complex (vertebral) of cervical region: Secondary | ICD-10-CM | POA: Diagnosis not present

## 2024-04-19 DIAGNOSIS — M9912 Subluxation complex (vertebral) of thoracic region: Secondary | ICD-10-CM | POA: Diagnosis not present

## 2024-04-19 DIAGNOSIS — M9914 Subluxation complex (vertebral) of sacral region: Secondary | ICD-10-CM | POA: Diagnosis not present

## 2024-04-19 DIAGNOSIS — M9913 Subluxation complex (vertebral) of lumbar region: Secondary | ICD-10-CM | POA: Diagnosis not present

## 2024-04-20 DIAGNOSIS — M9911 Subluxation complex (vertebral) of cervical region: Secondary | ICD-10-CM | POA: Diagnosis not present

## 2024-04-20 DIAGNOSIS — M9913 Subluxation complex (vertebral) of lumbar region: Secondary | ICD-10-CM | POA: Diagnosis not present

## 2024-04-20 DIAGNOSIS — M9912 Subluxation complex (vertebral) of thoracic region: Secondary | ICD-10-CM | POA: Diagnosis not present

## 2024-04-20 DIAGNOSIS — M9914 Subluxation complex (vertebral) of sacral region: Secondary | ICD-10-CM | POA: Diagnosis not present

## 2024-04-24 DIAGNOSIS — M9911 Subluxation complex (vertebral) of cervical region: Secondary | ICD-10-CM | POA: Diagnosis not present

## 2024-04-24 DIAGNOSIS — M9912 Subluxation complex (vertebral) of thoracic region: Secondary | ICD-10-CM | POA: Diagnosis not present

## 2024-04-24 DIAGNOSIS — M9913 Subluxation complex (vertebral) of lumbar region: Secondary | ICD-10-CM | POA: Diagnosis not present

## 2024-04-24 DIAGNOSIS — M9914 Subluxation complex (vertebral) of sacral region: Secondary | ICD-10-CM | POA: Diagnosis not present

## 2024-04-26 DIAGNOSIS — M9913 Subluxation complex (vertebral) of lumbar region: Secondary | ICD-10-CM | POA: Diagnosis not present

## 2024-04-26 DIAGNOSIS — M9912 Subluxation complex (vertebral) of thoracic region: Secondary | ICD-10-CM | POA: Diagnosis not present

## 2024-04-26 DIAGNOSIS — M9911 Subluxation complex (vertebral) of cervical region: Secondary | ICD-10-CM | POA: Diagnosis not present

## 2024-04-26 DIAGNOSIS — M9914 Subluxation complex (vertebral) of sacral region: Secondary | ICD-10-CM | POA: Diagnosis not present

## 2024-04-27 DIAGNOSIS — M9914 Subluxation complex (vertebral) of sacral region: Secondary | ICD-10-CM | POA: Diagnosis not present

## 2024-04-27 DIAGNOSIS — M9912 Subluxation complex (vertebral) of thoracic region: Secondary | ICD-10-CM | POA: Diagnosis not present

## 2024-04-27 DIAGNOSIS — M9913 Subluxation complex (vertebral) of lumbar region: Secondary | ICD-10-CM | POA: Diagnosis not present

## 2024-04-27 DIAGNOSIS — M9911 Subluxation complex (vertebral) of cervical region: Secondary | ICD-10-CM | POA: Diagnosis not present

## 2024-05-02 DIAGNOSIS — I788 Other diseases of capillaries: Secondary | ICD-10-CM | POA: Diagnosis not present

## 2024-05-02 DIAGNOSIS — L821 Other seborrheic keratosis: Secondary | ICD-10-CM | POA: Diagnosis not present

## 2024-05-02 DIAGNOSIS — D485 Neoplasm of uncertain behavior of skin: Secondary | ICD-10-CM | POA: Diagnosis not present

## 2024-05-02 DIAGNOSIS — Z7189 Other specified counseling: Secondary | ICD-10-CM | POA: Diagnosis not present

## 2024-05-02 DIAGNOSIS — C44212 Basal cell carcinoma of skin of right ear and external auricular canal: Secondary | ICD-10-CM | POA: Diagnosis not present

## 2024-05-08 DIAGNOSIS — M9911 Subluxation complex (vertebral) of cervical region: Secondary | ICD-10-CM | POA: Diagnosis not present

## 2024-05-08 DIAGNOSIS — M9914 Subluxation complex (vertebral) of sacral region: Secondary | ICD-10-CM | POA: Diagnosis not present

## 2024-05-08 DIAGNOSIS — M9912 Subluxation complex (vertebral) of thoracic region: Secondary | ICD-10-CM | POA: Diagnosis not present

## 2024-05-08 DIAGNOSIS — M9913 Subluxation complex (vertebral) of lumbar region: Secondary | ICD-10-CM | POA: Diagnosis not present

## 2024-05-10 DIAGNOSIS — M9912 Subluxation complex (vertebral) of thoracic region: Secondary | ICD-10-CM | POA: Diagnosis not present

## 2024-05-10 DIAGNOSIS — M9913 Subluxation complex (vertebral) of lumbar region: Secondary | ICD-10-CM | POA: Diagnosis not present

## 2024-05-10 DIAGNOSIS — M9911 Subluxation complex (vertebral) of cervical region: Secondary | ICD-10-CM | POA: Diagnosis not present

## 2024-05-10 DIAGNOSIS — M9914 Subluxation complex (vertebral) of sacral region: Secondary | ICD-10-CM | POA: Diagnosis not present

## 2024-05-11 DIAGNOSIS — M9911 Subluxation complex (vertebral) of cervical region: Secondary | ICD-10-CM | POA: Diagnosis not present

## 2024-05-11 DIAGNOSIS — M9913 Subluxation complex (vertebral) of lumbar region: Secondary | ICD-10-CM | POA: Diagnosis not present

## 2024-05-11 DIAGNOSIS — M9914 Subluxation complex (vertebral) of sacral region: Secondary | ICD-10-CM | POA: Diagnosis not present

## 2024-05-11 DIAGNOSIS — M9912 Subluxation complex (vertebral) of thoracic region: Secondary | ICD-10-CM | POA: Diagnosis not present

## 2024-05-15 DIAGNOSIS — M9913 Subluxation complex (vertebral) of lumbar region: Secondary | ICD-10-CM | POA: Diagnosis not present

## 2024-05-15 DIAGNOSIS — M9912 Subluxation complex (vertebral) of thoracic region: Secondary | ICD-10-CM | POA: Diagnosis not present

## 2024-05-15 DIAGNOSIS — M9914 Subluxation complex (vertebral) of sacral region: Secondary | ICD-10-CM | POA: Diagnosis not present

## 2024-05-15 DIAGNOSIS — M9911 Subluxation complex (vertebral) of cervical region: Secondary | ICD-10-CM | POA: Diagnosis not present

## 2024-05-16 DIAGNOSIS — C4441 Basal cell carcinoma of skin of scalp and neck: Secondary | ICD-10-CM | POA: Diagnosis not present

## 2024-05-17 DIAGNOSIS — M9913 Subluxation complex (vertebral) of lumbar region: Secondary | ICD-10-CM | POA: Diagnosis not present

## 2024-05-17 DIAGNOSIS — M9912 Subluxation complex (vertebral) of thoracic region: Secondary | ICD-10-CM | POA: Diagnosis not present

## 2024-05-17 DIAGNOSIS — M9914 Subluxation complex (vertebral) of sacral region: Secondary | ICD-10-CM | POA: Diagnosis not present

## 2024-05-17 DIAGNOSIS — M9911 Subluxation complex (vertebral) of cervical region: Secondary | ICD-10-CM | POA: Diagnosis not present

## 2024-05-18 DIAGNOSIS — M9913 Subluxation complex (vertebral) of lumbar region: Secondary | ICD-10-CM | POA: Diagnosis not present

## 2024-05-18 DIAGNOSIS — M9912 Subluxation complex (vertebral) of thoracic region: Secondary | ICD-10-CM | POA: Diagnosis not present

## 2024-05-18 DIAGNOSIS — M9911 Subluxation complex (vertebral) of cervical region: Secondary | ICD-10-CM | POA: Diagnosis not present

## 2024-05-18 DIAGNOSIS — M9914 Subluxation complex (vertebral) of sacral region: Secondary | ICD-10-CM | POA: Diagnosis not present

## 2024-05-22 DIAGNOSIS — M9911 Subluxation complex (vertebral) of cervical region: Secondary | ICD-10-CM | POA: Diagnosis not present

## 2024-05-22 DIAGNOSIS — M9913 Subluxation complex (vertebral) of lumbar region: Secondary | ICD-10-CM | POA: Diagnosis not present

## 2024-05-22 DIAGNOSIS — M9912 Subluxation complex (vertebral) of thoracic region: Secondary | ICD-10-CM | POA: Diagnosis not present

## 2024-05-22 DIAGNOSIS — M9914 Subluxation complex (vertebral) of sacral region: Secondary | ICD-10-CM | POA: Diagnosis not present

## 2024-05-24 DIAGNOSIS — M9912 Subluxation complex (vertebral) of thoracic region: Secondary | ICD-10-CM | POA: Diagnosis not present

## 2024-05-24 DIAGNOSIS — M9913 Subluxation complex (vertebral) of lumbar region: Secondary | ICD-10-CM | POA: Diagnosis not present

## 2024-05-24 DIAGNOSIS — M9911 Subluxation complex (vertebral) of cervical region: Secondary | ICD-10-CM | POA: Diagnosis not present

## 2024-05-24 DIAGNOSIS — M9914 Subluxation complex (vertebral) of sacral region: Secondary | ICD-10-CM | POA: Diagnosis not present

## 2024-05-25 DIAGNOSIS — M9914 Subluxation complex (vertebral) of sacral region: Secondary | ICD-10-CM | POA: Diagnosis not present

## 2024-05-25 DIAGNOSIS — M9912 Subluxation complex (vertebral) of thoracic region: Secondary | ICD-10-CM | POA: Diagnosis not present

## 2024-05-25 DIAGNOSIS — M9913 Subluxation complex (vertebral) of lumbar region: Secondary | ICD-10-CM | POA: Diagnosis not present

## 2024-05-25 DIAGNOSIS — M9911 Subluxation complex (vertebral) of cervical region: Secondary | ICD-10-CM | POA: Diagnosis not present

## 2024-05-29 DIAGNOSIS — M9911 Subluxation complex (vertebral) of cervical region: Secondary | ICD-10-CM | POA: Diagnosis not present

## 2024-05-29 DIAGNOSIS — M9912 Subluxation complex (vertebral) of thoracic region: Secondary | ICD-10-CM | POA: Diagnosis not present

## 2024-05-29 DIAGNOSIS — M9914 Subluxation complex (vertebral) of sacral region: Secondary | ICD-10-CM | POA: Diagnosis not present

## 2024-05-29 DIAGNOSIS — M9913 Subluxation complex (vertebral) of lumbar region: Secondary | ICD-10-CM | POA: Diagnosis not present

## 2024-06-05 DIAGNOSIS — M9911 Subluxation complex (vertebral) of cervical region: Secondary | ICD-10-CM | POA: Diagnosis not present

## 2024-06-05 DIAGNOSIS — M9913 Subluxation complex (vertebral) of lumbar region: Secondary | ICD-10-CM | POA: Diagnosis not present

## 2024-06-05 DIAGNOSIS — M9912 Subluxation complex (vertebral) of thoracic region: Secondary | ICD-10-CM | POA: Diagnosis not present

## 2024-06-05 DIAGNOSIS — M9914 Subluxation complex (vertebral) of sacral region: Secondary | ICD-10-CM | POA: Diagnosis not present

## 2024-06-13 DIAGNOSIS — M545 Low back pain, unspecified: Secondary | ICD-10-CM | POA: Diagnosis not present

## 2024-06-14 DIAGNOSIS — E559 Vitamin D deficiency, unspecified: Secondary | ICD-10-CM | POA: Diagnosis not present

## 2024-06-14 DIAGNOSIS — M47816 Spondylosis without myelopathy or radiculopathy, lumbar region: Secondary | ICD-10-CM | POA: Diagnosis not present

## 2024-06-14 DIAGNOSIS — Z0000 Encounter for general adult medical examination without abnormal findings: Secondary | ICD-10-CM | POA: Diagnosis not present

## 2024-06-14 DIAGNOSIS — R739 Hyperglycemia, unspecified: Secondary | ICD-10-CM | POA: Diagnosis not present

## 2024-06-14 DIAGNOSIS — G4730 Sleep apnea, unspecified: Secondary | ICD-10-CM | POA: Diagnosis not present

## 2024-06-14 DIAGNOSIS — R5383 Other fatigue: Secondary | ICD-10-CM | POA: Diagnosis not present

## 2024-06-14 DIAGNOSIS — Z6830 Body mass index (BMI) 30.0-30.9, adult: Secondary | ICD-10-CM | POA: Diagnosis not present

## 2024-06-14 DIAGNOSIS — E66811 Obesity, class 1: Secondary | ICD-10-CM | POA: Diagnosis not present

## 2024-06-14 DIAGNOSIS — Z1211 Encounter for screening for malignant neoplasm of colon: Secondary | ICD-10-CM | POA: Diagnosis not present

## 2024-06-14 DIAGNOSIS — Z13220 Encounter for screening for lipoid disorders: Secondary | ICD-10-CM | POA: Diagnosis not present

## 2024-06-14 DIAGNOSIS — E6609 Other obesity due to excess calories: Secondary | ICD-10-CM | POA: Diagnosis not present

## 2024-06-14 DIAGNOSIS — M5136 Other intervertebral disc degeneration, lumbar region with discogenic back pain only: Secondary | ICD-10-CM | POA: Diagnosis not present

## 2024-06-14 DIAGNOSIS — Z1331 Encounter for screening for depression: Secondary | ICD-10-CM | POA: Diagnosis not present

## 2024-06-21 DIAGNOSIS — M47816 Spondylosis without myelopathy or radiculopathy, lumbar region: Secondary | ICD-10-CM | POA: Diagnosis not present

## 2024-06-21 DIAGNOSIS — M25551 Pain in right hip: Secondary | ICD-10-CM | POA: Diagnosis not present

## 2024-06-21 DIAGNOSIS — M25552 Pain in left hip: Secondary | ICD-10-CM | POA: Diagnosis not present

## 2024-07-04 DIAGNOSIS — H33193 Other retinoschisis and retinal cysts, bilateral: Secondary | ICD-10-CM | POA: Diagnosis not present

## 2024-07-11 DIAGNOSIS — N529 Male erectile dysfunction, unspecified: Secondary | ICD-10-CM | POA: Diagnosis not present

## 2024-07-11 DIAGNOSIS — H903 Sensorineural hearing loss, bilateral: Secondary | ICD-10-CM | POA: Diagnosis not present

## 2024-07-11 DIAGNOSIS — Z6831 Body mass index (BMI) 31.0-31.9, adult: Secondary | ICD-10-CM | POA: Diagnosis not present

## 2024-07-11 DIAGNOSIS — K7460 Unspecified cirrhosis of liver: Secondary | ICD-10-CM | POA: Diagnosis not present

## 2024-07-11 DIAGNOSIS — Z0000 Encounter for general adult medical examination without abnormal findings: Secondary | ICD-10-CM | POA: Diagnosis not present

## 2024-07-11 DIAGNOSIS — G4733 Obstructive sleep apnea (adult) (pediatric): Secondary | ICD-10-CM | POA: Diagnosis not present

## 2024-07-11 DIAGNOSIS — I480 Paroxysmal atrial fibrillation: Secondary | ICD-10-CM | POA: Diagnosis not present

## 2024-07-12 DIAGNOSIS — M5416 Radiculopathy, lumbar region: Secondary | ICD-10-CM | POA: Diagnosis not present

## 2024-08-07 DIAGNOSIS — H903 Sensorineural hearing loss, bilateral: Secondary | ICD-10-CM | POA: Diagnosis not present

## 2024-09-22 ENCOUNTER — Encounter: Payer: Self-pay | Admitting: Cardiology
